# Patient Record
Sex: Female | Born: 1990 | Race: Black or African American | Hispanic: No | Marital: Single | Smoking: Never smoker
Health system: Southern US, Community
[De-identification: ages and names within clinical notes are randomized; demographics above are authoritative.]

## PROBLEM LIST (undated history)

## (undated) DIAGNOSIS — Z789 Other specified health status: Secondary | ICD-10-CM

## (undated) HISTORY — PX: NO PAST SURGERIES: SHX2092

---

## 2011-07-31 ENCOUNTER — Encounter: Payer: Self-pay | Admitting: Emergency Medicine

## 2011-07-31 ENCOUNTER — Emergency Department (INDEPENDENT_AMBULATORY_CARE_PROVIDER_SITE_OTHER)
Admission: EM | Admit: 2011-07-31 | Discharge: 2011-07-31 | Disposition: A | Discharge status: Other Acute Inpatient Hospital | Source: Home / Self Care | Attending: Emergency Medicine | Admitting: Emergency Medicine

## 2011-07-31 DIAGNOSIS — H02849 Edema of unspecified eye, unspecified eyelid: Secondary | ICD-10-CM

## 2011-07-31 MED ORDER — TOBRAMYCIN-DEXAMETHASONE 0.3-0.05 % OP SUSP: 2.0000 [drp] | Freq: Three times a day (TID) | OPHTHALMIC | Status: DC

## 2011-07-31 MED ORDER — TETRACAINE HCL 0.5 % OP SOLN
OPHTHALMIC | Status: AC
  Filled 2011-07-31: qty 2

## 2011-07-31 MED ORDER — TOBRAMYCIN-DEXAMETHASONE 0.3-0.1 % OP OINT: TOPICAL_OINTMENT | Freq: Three times a day (TID) | OPHTHALMIC | Status: AC

## 2011-07-31 NOTE — ED Notes (Signed)
Last night noticed redness, patient removed contacts last night and reports seeing a bump under left upper lid.  Eye red, sore.  Vision more blurry

## 2011-07-31 NOTE — ED Notes (Signed)
Attempted triage, Allison Nguyen emt at bedside obtaining vital signs

## 2011-07-31 NOTE — ED Notes (Signed)
Dr coll at bedside 

## 2011-07-31 NOTE — ED Notes (Signed)
Returned eye box, eye wash, returned tetracaine to pyxis

## 2011-07-31 NOTE — ED Provider Notes (Addendum)
History     CSN: [id number] Arrival date & time: 07/31/2011  2:35 PM   First MD Initiated Contact with Patient 07/31/11 1339      Chief Complaint  Patient presents with  . Eye Problem     HPI Comments: Last night, while I removed my contact lenses felt like my L upper eyelid was tender and itchy and looked like it was swollen..  Patient is a 20 y.o. female presenting with eye problem. The history is provided by the patient.  Eye Problem  This is a new problem. The current episode started yesterday. The problem occurs constantly. The problem has not changed since onset.There is pain in the left eye. The injury mechanism was contact lenses. The pain is at a severity of 2/10. The pain is mild. There is no history of trauma to the eye. There is no known exposure to pink eye. She wears contacts. Associated symptoms include blurred vision, foreign body sensation, eye redness and itching. Pertinent negatives include no decreased vision, no discharge, no nausea and no vomiting. She has tried nothing for the symptoms.    History reviewed. No pertinent past medical history.  History reviewed. No pertinent past surgical history.  Family History  Problem Relation Age of Onset  . Hypertension Other     History  Substance Use Topics  . Smoking status: Never Smoker   . Smokeless tobacco: Not on file  . Alcohol Use: No    OB History    Grav Para Term Preterm Abortions TAB SAB Ect Mult Living                  Review of Systems  Constitutional: Negative for fever.  Eyes: Positive for blurred vision, pain, redness and itching. Negative for discharge and visual disturbance.  Gastrointestinal: Negative for nausea and vomiting.  Skin: Positive for itching.    Allergies  Review of patient's allergies indicates no known allergies.  Home Medications   Current Outpatient Rx  Name Route Sig Dispense Refill  . TOBRAMYCIN-DEXAMETHASONE 0.3-0.1 % OP OINT Left Eye Place into the left eye 3  (three) times daily. Use for 5 days. 3.5 g 0    BP 125/60  Pulse 64  Temp(Src) 97.2 F (36.2 C) (Oral)  Resp 16  SpO2 100%  LMP 07/10/2011  Physical Exam  Nursing note and vitals reviewed. Constitutional: She appears well-nourished. No distress.  HENT:  Head: Normocephalic.  Eyes: Conjunctivae, EOM and lids are normal. Pupils are equal, round, and reactive to light. No foreign bodies found. Left eye exhibits no discharge. No foreign body present in the left eye. No scleral icterus.      ED Course  Procedures (including critical care time)  Labs Reviewed - No data to display No results found.   1. Eyelid edema       MDM  NO FOREIGN BODY AND NO HORDUOLUM        Jimmie Molly, MD 07/31/11 1736  Jimmie Molly, MD 07/31/11 [phone]

## 2013-11-22 ENCOUNTER — Encounter (HOSPITAL_COMMUNITY): Payer: Self-pay | Admitting: Emergency Medicine

## 2013-11-22 ENCOUNTER — Emergency Department (INDEPENDENT_AMBULATORY_CARE_PROVIDER_SITE_OTHER)
Admission: EM | Admit: 2013-11-22 | Discharge: 2013-11-22 | Disposition: A | Discharge status: Home / Self Care | Source: Home / Self Care | Attending: Family Medicine | Admitting: Family Medicine

## 2013-11-22 DIAGNOSIS — A084 Viral intestinal infection, unspecified: Secondary | ICD-10-CM

## 2013-11-22 DIAGNOSIS — A088 Other specified intestinal infections: Secondary | ICD-10-CM

## 2013-11-22 MED ORDER — ONDANSETRON 4 MG PO TBDP
ORAL_TABLET | ORAL | Status: AC
  Filled 2013-11-22: qty 2

## 2013-11-22 MED ORDER — ONDANSETRON HCL 8 MG PO TABS: 8.0000 mg | ORAL_TABLET | Freq: Three times a day (TID) | ORAL | Status: DC | PRN

## 2013-11-22 MED ORDER — ONDANSETRON 4 MG PO TBDP
8.0000 mg | ORAL_TABLET | Freq: Once | ORAL | Status: AC
  Administered 2013-11-22: 8 mg via ORAL

## 2013-11-22 NOTE — ED Provider Notes (Signed)
Allison EmeryKirsten Nguyen is a 23 y.o. female who presents to Urgent Care today for vomiting and diarrhea present for one day. Patient notes abdominal cramping. She denies any blood in her vomit or stool or black tarry stool or coffee-ground emesis. She notes subjective fever which is past. She's tried Tylenol which helped a bit. No chest pains palpitations or shortness of breath.   No past medical history on file. History  Substance Use Topics  . Smoking status: Never Smoker   . Smokeless tobacco: Not on file  . Alcohol Use: No   ROS as above Medications: Current Facility-Administered Medications  Medication Dose Route Frequency Provider Last Rate Last Dose  . ondansetron (ZOFRAN-ODT) disintegrating tablet 8 mg  8 mg Oral Once Rodolph BongEvan S Baldemar Dady, MD       Current Outpatient Prescriptions  Medication Sig Dispense Refill  . ondansetron (ZOFRAN) 8 MG tablet Take 1 tablet (8 mg total) by mouth every 8 (eight) hours as needed for nausea or vomiting.  20 tablet  0    Exam:  BP 116/78  Pulse 66  Temp(Src) 97.9 F (36.6 C) (Oral)  Resp 18  SpO2 100% Gen: Well NAD HEENT: EOMI,  MMM Lungs: Normal work of breathing. CTABL Heart: RRR no MRG Abd: NABS, Soft. NT, ND no rebound or guarding Exts: Brisk capillary refill, warm and well perfused.    Assessment and Plan: 23 y.o. female with viral gastroenteritis. Plan to treat symptomatically with Zofran and over-the-counter Imodium. Followup with primary care provider as needed.  Discussed warning signs or symptoms. Please see discharge instructions. Patient expresses understanding.    Rodolph BongEvan S Tristian Sickinger, MD 11/22/13 [phone]

## 2013-11-22 NOTE — Discharge Instructions (Signed)
Thank you for coming in today. If your belly pain worsens, or you have high fever, bad vomiting, blood in your stool or black tarry stool go to the Emergency Room.  Viral Gastroenteritis Viral gastroenteritis is also known as stomach flu. This condition affects the stomach and intestinal tract. It can cause sudden diarrhea and vomiting. The illness typically lasts 3 to 8 days. Most people develop an immune response that eventually gets rid of the virus. While this natural response develops, the virus can make you quite ill. CAUSES  Many different viruses can cause gastroenteritis, such as rotavirus or noroviruses. You can catch one of these viruses by consuming contaminated food or water. You may also catch a virus by sharing utensils or other personal items with an infected person or by touching a contaminated surface. SYMPTOMS  The most common symptoms are diarrhea and vomiting. These problems can cause a severe loss of body fluids (dehydration) and a body salt (electrolyte) imbalance. Other symptoms may include:  Fever.  Headache.  Fatigue.  Abdominal pain. DIAGNOSIS  Your caregiver can usually diagnose viral gastroenteritis based on your symptoms and a physical exam. A stool sample may also be taken to test for the presence of viruses or other infections. TREATMENT  This illness typically goes away on its own. Treatments are aimed at rehydration. The most serious cases of viral gastroenteritis involve vomiting so severely that you are not able to keep fluids down. In these cases, fluids must be given through an intravenous line (IV). HOME CARE INSTRUCTIONS   Drink enough fluids to keep your urine clear or pale yellow. Drink small amounts of fluids frequently and increase the amounts as tolerated.  Ask your caregiver for specific rehydration instructions.  Avoid:  Foods high in sugar.  Alcohol.  Carbonated drinks.  Tobacco.  Juice.  Caffeine drinks.  Extremely hot or cold  fluids.  Fatty, greasy foods.  Too much intake of anything at one time.  Dairy products until 24 to 48 hours after diarrhea stops.  You may consume probiotics. Probiotics are active cultures of beneficial bacteria. They may lessen the amount and number of diarrheal stools in adults. Probiotics can be found in yogurt with active cultures and in supplements.  Wash your hands well to avoid spreading the virus.  Only take over-the-counter or prescription medicines for pain, discomfort, or fever as directed by your caregiver. Do not give aspirin to children. Antidiarrheal medicines are not recommended.  Ask your caregiver if you should continue to take your regular prescribed and over-the-counter medicines.  Keep all follow-up appointments as directed by your caregiver. SEEK IMMEDIATE MEDICAL CARE IF:   You are unable to keep fluids down.  You do not urinate at least once every 6 to 8 hours.  You develop shortness of breath.  You notice blood in your stool or vomit. This may look like coffee grounds.  You have abdominal pain that increases or is concentrated in one small area (localized).  You have persistent vomiting or diarrhea.  You have a fever.  The patient is a child younger than 3 months, and he or she has a fever.  The patient is a child older than 3 months, and he or she has a fever and persistent symptoms.  The patient is a child older than 3 months, and he or she has a fever and symptoms suddenly get worse.  The patient is a baby, and he or she has no tears when crying. MAKE SURE YOU:  Understand these instructions.  Will watch your condition.  Will get help right away if you are not doing well or get worse. Document Released: 09/01/2005 Document Revised: 11/24/2011 Document Reviewed: 06/18/2011 [facility] Patient Information 2014 [place].

## 2013-11-22 NOTE — ED Notes (Signed)
C/o vomiting onset yesterday x 7 and 3 times today.  Diarrhea x1 yesterday and 2 today.  C/o pain all over her abdomen.  C/o sweating after vomiting, but did not check to see if she had a fever.

## 2014-03-07 ENCOUNTER — Encounter (HOSPITAL_COMMUNITY): Payer: Self-pay | Admitting: Emergency Medicine

## 2014-03-07 ENCOUNTER — Emergency Department (INDEPENDENT_AMBULATORY_CARE_PROVIDER_SITE_OTHER)
Admission: EM | Admit: 2014-03-07 | Discharge: 2014-03-07 | Disposition: A | Discharge status: Home / Self Care | Source: Home / Self Care | Attending: Family Medicine | Admitting: Family Medicine

## 2014-03-07 DIAGNOSIS — H109 Unspecified conjunctivitis: Secondary | ICD-10-CM

## 2014-03-07 MED ORDER — CIPROFLOXACIN HCL 0.3 % OP SOLN: OPHTHALMIC | Status: DC

## 2014-03-07 MED ORDER — CIPROFLOXACIN HCL 0.3 % OP OINT
TOPICAL_OINTMENT | OPHTHALMIC | Status: DC
Start: 2014-03-07 — End: 2014-11-06

## 2014-03-07 NOTE — Discharge Instructions (Signed)
Nieve,   It was estimated am concerned he had an infection caused by the contact lenses. EDD is the antibiotic for the next 7 days. He can use the drops or the ointment, whichever is cheaper for her whichever you prefer. If the problem gets worse or is not improving then please followup with the eye doctor.   Sincerely,   Dr. Clinton SawyerWilliamson

## 2014-03-07 NOTE — ED Provider Notes (Signed)
CSN: [id number]     Arrival date & time 03/07/14  1753 History   First MD Initiated Contact with Patient 03/07/14 1841     Chief Complaint  Patient presents with  . Eye Problem   (Consider location/radiation/quality/duration/timing/severity/associated sxs/prior Treatment) HPI  Eye irritation - bilateral, 3 days duration, started after the patient used contact lenses for the first time in several months. She will contact overnight and another next morning. Since that time she's had redness and right sensitivity. She has not been wearing her contacts but has been wearing her glasses. She denies any fever or chills.  History reviewed. No pertinent past medical history. History reviewed. No pertinent past surgical history. Family History  Problem Relation Age of Onset  . Hypertension Other    History  Substance Use Topics  . Smoking status: Never Smoker   . Smokeless tobacco: Not on file  . Alcohol Use: 1.8 oz/week    1 Cans of beer, 2 Shots of liquor per week     Comment: 3 times/week a mixture.   OB History   Grav Para Term Preterm Abortions TAB SAB Ect Mult Living                 Review of Systems Positive for eye pain irritation bilaterally Negative for vision changes Allergies  Review of patient's allergies indicates no known allergies.  Home Medications   Prior to Admission medications   Medication Sig Start Date End Date Taking? Authorizing Emanuel Dowson  ciprofloxacin (CILOXAN) 0.3 % ophthalmic ointment 1/2 inch ribbon into both eye 3 times per day for first 2 days, then 1/2 inch ribbon 2 times per day for next 5 days 03/07/14   Garnetta BuddyEdward Williamson V, MD  ciprofloxacin (CILOXAN) 0.3 % ophthalmic solution Administer 1 drop, every 2 hours, while awake, for 2 days. Then 1 drop, every 4 hours, while awake, for the next 5 days. 03/07/14   Garnetta BuddyEdward Williamson V, MD  ondansetron (ZOFRAN) 8 MG tablet Take 1 tablet (8 mg total) by mouth every 8 (eight) hours as needed for nausea or  vomiting. 11/22/13   Rodolph BongEvan S Corey, MD   BP 119/71  Pulse 76  Temp(Src) 98.6 F (37 C) (Oral)  Resp 16  SpO2 100%  LMP 01/25/2014 Physical Exam Gen: Young African American female, well-appearing, pleasant Eyes: injected conjunctiva bilaterally with mild chemosis, light sensitivity present, pupils equal round reactive to light, extraocular movements intact, fluorescein eye exam without evidence of abrasion  ED Course  Procedures (including critical care time) Labs Review Labs Reviewed - No data to display  Imaging Review No results found.   MDM   1. Bilateral conjunctivitis    No evidence of abrasions or lacerations on fluoroscein eye exam. I will treat as like a bacterial conjunctivitis with possibility of Pseudomonas given her use of contact lenses. She will use Cipro eyedrops for 7 days. Given precautions to follow up with ophthalmologist.    Garnetta BuddyEdward Williamson V, MD 03/07/14 [phone]

## 2014-03-07 NOTE — ED Notes (Signed)
See physician note

## 2014-03-08 NOTE — ED Provider Notes (Signed)
Medical screening examination/treatment/procedure(s) were performed by a resident physician and as supervising physician I was immediately available for consultation/collaboration.  Leslee Homeavid Keller, M.D.  Reuben Likesavid C Keller, MD 03/08/14 [phone]

## 2014-03-16 ENCOUNTER — Emergency Department (INDEPENDENT_AMBULATORY_CARE_PROVIDER_SITE_OTHER)
Admission: EM | Admit: 2014-03-16 | Discharge: 2014-03-16 | Disposition: A | Discharge status: Home / Self Care | Source: Home / Self Care | Attending: Family Medicine | Admitting: Family Medicine

## 2014-03-16 ENCOUNTER — Other Ambulatory Visit (HOSPITAL_COMMUNITY)
Admission: RE | Admit: 2014-03-16 | Discharge: 2014-03-16 | Disposition: A | Discharge status: Home / Self Care | Source: Ambulatory Visit | Attending: Family Medicine | Admitting: Family Medicine

## 2014-03-16 ENCOUNTER — Encounter (HOSPITAL_COMMUNITY): Payer: Self-pay | Admitting: Emergency Medicine

## 2014-03-16 DIAGNOSIS — N760 Acute vaginitis: Secondary | ICD-10-CM | POA: Insufficient documentation

## 2014-03-16 DIAGNOSIS — A499 Bacterial infection, unspecified: Secondary | ICD-10-CM

## 2014-03-16 DIAGNOSIS — B9689 Other specified bacterial agents as the cause of diseases classified elsewhere: Secondary | ICD-10-CM

## 2014-03-16 DIAGNOSIS — Z113 Encounter for screening for infections with a predominantly sexual mode of transmission: Secondary | ICD-10-CM | POA: Insufficient documentation

## 2014-03-16 LAB — POCT URINALYSIS DIP (DEVICE)
Bilirubin Urine: NEGATIVE
Glucose, UA: NEGATIVE mg/dL
Ketones, ur: NEGATIVE mg/dL
Leukocytes, UA: NEGATIVE
Nitrite: NEGATIVE
Protein, ur: NEGATIVE mg/dL
Specific Gravity, Urine: >=1.03 (ref 1.005–1.030)
Urobilinogen, UA: 0.2 mg/dL (ref 0.0–1.0)
pH: 6 (ref 5.0–8.0)

## 2014-03-16 LAB — POCT PREGNANCY, URINE: PREG TEST UR: NEGATIVE

## 2014-03-16 MED ORDER — METRONIDAZOLE 500 MG PO TABS: 500.0000 mg | ORAL_TABLET | Freq: Two times a day (BID) | ORAL | Status: DC

## 2014-03-16 NOTE — ED Notes (Signed)
Pt reports    Symptoms  Of         Vaginal     Itching /  Irritation          With a  White  Discharge            With  Symptoms  X  sev  Weeks         Pt  Reports  She  Has   Changed  Soaps         And  Has  Been  Using  Different tissue  As  Well

## 2014-03-16 NOTE — ED Provider Notes (Signed)
CSN: [id number]     Arrival date & time 03/16/14  1251 History   First MD Initiated Contact with Patient 03/16/14 1350     Chief Complaint  Patient presents with  . Vaginal Discharge   (Consider location/radiation/quality/duration/timing/severity/associated sxs/prior Treatment) Patient is a 23 y.o. female presenting with vaginal discharge. The history is provided by the patient. No language interpreter was used.  Vaginal Discharge Quality:  Thick and white Severity:  Moderate Onset quality:  Gradual Timing:  Constant Progression:  Worsening Chronicity:  New Relieved by:  Nothing Worsened by:  Nothing tried Ineffective treatments:  None tried Associated symptoms: no abdominal pain   Risk factors: no STI exposure     History reviewed. No pertinent past medical history. History reviewed. No pertinent past surgical history. Family History  Problem Relation Age of Onset  . Hypertension Other    History  Substance Use Topics  . Smoking status: Never Smoker   . Smokeless tobacco: Not on file  . Alcohol Use: 1.8 oz/week    1 Cans of beer, 2 Shots of liquor per week     Comment: 3 times/week a mixture.   OB History   Grav Para Term Preterm Abortions TAB SAB Ect Mult Living                 Review of Systems  Gastrointestinal: Negative for abdominal pain.  Genitourinary: Positive for vaginal discharge.  All other systems reviewed and are negative.   Allergies  Review of patient's allergies indicates no known allergies.  Home Medications   Prior to Admission medications   Medication Sig Start Date End Date Taking? Authorizing Provider  ciprofloxacin (CILOXAN) 0.3 % ophthalmic ointment 1/2 inch ribbon into both eye 3 times per day for first 2 days, then 1/2 inch ribbon 2 times per day for next 5 days 03/07/14   Garnetta BuddyEdward Williamson V, MD  ciprofloxacin (CILOXAN) 0.3 % ophthalmic solution Administer 1 drop, every 2 hours, while awake, for 2 days. Then 1 drop, every 4 hours, while  awake, for the next 5 days. 03/07/14   Garnetta BuddyEdward Williamson V, MD  ondansetron (ZOFRAN) 8 MG tablet Take 1 tablet (8 mg total) by mouth every 8 (eight) hours as needed for nausea or vomiting. 11/22/13   Rodolph BongEvan S Corey, MD   BP 121/79  Pulse 88  Temp(Src) 98.4 F (36.9 C) (Oral)  Resp 18  SpO2 100%  LMP 01/25/2014 Physical Exam  Constitutional: She appears well-developed and well-nourished.  HENT:  Head: Normocephalic and atraumatic.  Eyes: Conjunctivae are normal. Pupils are equal, round, and reactive to light.  Neck: Normal range of motion. Neck supple.  Cardiovascular: Normal rate.   Pulmonary/Chest: Effort normal.  Abdominal: Soft. There is no tenderness.  Genitourinary: Vaginal discharge found.  Vaginal discharge,  Thick white,  Adnexa no masses,  Cervix nontender  Musculoskeletal: Normal range of motion.  Neurological: She is alert.  Skin: Skin is warm.  Psychiatric: She has a normal mood and affect.    ED Course  Procedures (including critical care time) Labs Review Labs Reviewed  POCT URINALYSIS DIP (DEVICE) - Abnormal; Notable for the following:    Hgb urine dipstick TRACE (*)    All other components within normal limits  POCT PREGNANCY, URINE    Imaging Review No results found.   MDM   1. BV (bacterial vaginosis)    Flagyl rx Cultures pending    Elson AreasLeslie K Sofia, PA-C 03/16/14 1428

## 2014-03-16 NOTE — Discharge Instructions (Signed)
Bacterial Vaginosis Bacterial vaginosis is a vaginal infection that occurs when the normal balance of bacteria in the vagina is disrupted. It results from an overgrowth of certain bacteria. This is the most common vaginal infection in women of childbearing age. Treatment is important to prevent complications, especially in pregnant women, as it can cause a premature delivery. CAUSES  Bacterial vaginosis is caused by an increase in harmful bacteria that are normally present in smaller amounts in the vagina. Several different kinds of bacteria can cause bacterial vaginosis. However, the reason that the condition develops is not fully understood. RISK FACTORS Certain activities or behaviors can put you at an increased risk of developing bacterial vaginosis, including:  Having a new sex partner or multiple sex partners.  Douching.  Using an intrauterine device (IUD) for contraception. Women do not get bacterial vaginosis from toilet seats, bedding, swimming pools, or contact with objects around them. SIGNS AND SYMPTOMS  Some women with bacterial vaginosis have no signs or symptoms. Common symptoms include:  Grey vaginal discharge.  A fishlike odor with discharge, especially after sexual intercourse.  Itching or burning of the vagina and vulva.  Burning or pain with urination. DIAGNOSIS  Your health care provider will take a medical history and examine the vagina for signs of bacterial vaginosis. A sample of vaginal fluid may be taken. Your health care provider will look at this sample under a microscope to check for bacteria and abnormal cells. A vaginal pH test may also be done.  TREATMENT  Bacterial vaginosis may be treated with antibiotic medicines. These may be given in the form of a pill or a vaginal cream. A second round of antibiotics may be prescribed if the condition comes back after treatment.  HOME CARE INSTRUCTIONS   Only take over-the-counter or prescription medicines as  directed by your health care provider.  If antibiotic medicine was prescribed, take it as directed. Make sure you finish it even if you start to feel better.  Do not have sex until treatment is completed.  Tell all sexual partners that you have a vaginal infection. They should see their health care provider and be treated if they have problems, such as a mild rash or itching.  Practice safe sex by using condoms and only having one sex partner. SEEK MEDICAL CARE IF:   Your symptoms are not improving after 3 days of treatment.  You have increased discharge or pain.  You have a fever. MAKE SURE YOU:   Understand these instructions.  Will watch your condition.  Will get help right away if you are not doing well or get worse. FOR MORE INFORMATION  Centers for Disease Control and Prevention, Division of STD Prevention: www.cdc.gov/std American Sexual Health Association (ASHA): www.ashastd.org  Document Released: 09/01/2005 Document Revised: 06/22/2013 Document Reviewed: 04/13/2013 [employer] Patient Information 2015 [employer]. This information is not intended to replace advice given to you by your health care provider. Make sure you discuss any questions you have with your health care provider.  

## 2014-03-17 NOTE — ED Provider Notes (Signed)
Medical screening examination/treatment/procedure(s) were performed by a resident physician or non-physician practitioner and as the supervising physician I was immediately available for consultation/collaboration.  Mylo Choi, MD    Velicia Dejager S Meloney Feld, MD 03/17/14 1716 

## 2014-09-11 ENCOUNTER — Other Ambulatory Visit (HOSPITAL_COMMUNITY)
Admission: RE | Admit: 2014-09-11 | Discharge: 2014-09-11 | Disposition: A | Discharge status: Home / Self Care | Source: Ambulatory Visit | Attending: Emergency Medicine | Admitting: Emergency Medicine

## 2014-09-11 ENCOUNTER — Encounter (HOSPITAL_COMMUNITY): Payer: Self-pay | Admitting: Emergency Medicine

## 2014-09-11 ENCOUNTER — Emergency Department (INDEPENDENT_AMBULATORY_CARE_PROVIDER_SITE_OTHER)
Admission: EM | Admit: 2014-09-11 | Discharge: 2014-09-11 | Disposition: A | Discharge status: Home / Self Care | Source: Home / Self Care | Attending: Emergency Medicine | Admitting: Emergency Medicine

## 2014-09-11 DIAGNOSIS — N760 Acute vaginitis: Secondary | ICD-10-CM | POA: Diagnosis present

## 2014-09-11 DIAGNOSIS — Z113 Encounter for screening for infections with a predominantly sexual mode of transmission: Secondary | ICD-10-CM | POA: Insufficient documentation

## 2014-09-11 LAB — POCT URINALYSIS DIP (DEVICE)
BILIRUBIN URINE: NEGATIVE
Glucose, UA: NEGATIVE mg/dL
Ketones, ur: NEGATIVE mg/dL
NITRITE: NEGATIVE
PH: 7.5 (ref 5.0–8.0)
PROTEIN: NEGATIVE mg/dL
Specific Gravity, Urine: 1.015 (ref 1.005–1.030)
UROBILINOGEN UA: 0.2 mg/dL (ref 0.0–1.0)

## 2014-09-11 LAB — HIV ANTIBODY (ROUTINE TESTING W REFLEX): HIV: NONREACTIVE

## 2014-09-11 LAB — POCT PREGNANCY, URINE: Preg Test, Ur: NEGATIVE

## 2014-09-11 MED ORDER — FLUCONAZOLE 150 MG PO TABS: 150.0000 mg | ORAL_TABLET | Freq: Once | ORAL | Status: DC

## 2014-09-11 MED ORDER — METRONIDAZOLE 500 MG PO TABS
500.0000 mg | ORAL_TABLET | Freq: Two times a day (BID) | ORAL | Status: DC
Start: 2014-09-11 — End: 2014-11-06

## 2014-09-11 NOTE — ED Notes (Signed)
Pt states that she has had vaginal itching for 2 weeks.

## 2014-09-11 NOTE — Discharge Instructions (Signed)
Bacterial Vaginosis Bacterial vaginosis is an infection of the vagina. It happens when too many of certain germs (bacteria) grow in the vagina. HOME CARE  Take your medicine as told by your doctor.  Finish your medicine even if you start to feel better.  Do not have sex until you finish your medicine and are better.  Tell your sex partner that you have an infection. They should see their doctor for treatment.  Practice safe sex. Use condoms. Have only one sex partner. GET HELP IF:  You are not getting better after 3 days of treatment.  You have more grey fluid (discharge) coming from your vagina than before.  You have more pain than before.  You have a fever. MAKE SURE YOU:   Understand these instructions.  Will watch your condition.  Will get help right away if you are not doing well or get worse. Document Released: 06/10/2008 Document Revised: 06/22/2013 Document Reviewed: 04/13/2013 [facility] Patient Information 2015 [place]. This information is not intended to replace advice given to you by your health care provider. Make sure you discuss any questions you have with your health care provider.  To restore the normal balance of "good bacteria" in your system.  Take a probiotic once daily.  These can be gotten over the counter at the drug store without a prescription and come under various brand names such as Culturelle, Align, Florastore, and [employer].  The best thing to do is to ask your pharmacist to recommend a good probiotic that is not too expensive.

## 2014-09-11 NOTE — ED Provider Notes (Signed)
   Chief Complaint   Vaginal Itching   History of Present Illness   Allison Nguyen is a 23 year old female who has had a two-week history of vulvar and vaginal itching and irritation, discharge, and older. She denies any rash, ulcerations, blisters, or external lesions. She has occasional pelvic pain. She notes some urinary urgency but no frequency or dysuria. No blood in the urine. She denies any lower back pain. She's had no fever, chills, nausea, or vomiting. She has a history of bacterial vaginosis in the past. Her last menstrual period is been within the past month. She is sexually active but only with a single female partner.  Review of Systems   Other than as noted above, the patient denies any of the following symptoms: Systemic:  No fever or chills GI:  No abdominal pain, nausea, vomiting, diarrhea, constipation, melena or hematochezia. GU:  No dysuria, frequency, urgency, hematuria, vaginal discharge, itching, or abnormal vaginal bleeding.  PMFSH   Past medical history, family history, social history, meds, and allergies were reviewed.    Physical Examination    Vital signs:  BP 115/72 mmHg  Pulse 68  Temp(Src) 98.3 F (36.8 C) (Oral)  Resp 14  SpO2 100% General:  Alert, oriented and in no distress. Lungs:  Breath sounds clear and equal bilaterally.  No wheezes, rales or rhonchi. Heart:  Regular rhythm.  No gallops or murmers. Abdomen:  Soft, flat and non-distended.  No organomegaly or mass.  No tenderness, guarding or rebound.  Bowel sounds normally active. Pelvic exam:  Normal external genitalia, no ulcerations or blisters. Vaginal and cervical mucosa were normal. There was a scant amount of mucoid discharge coming from the cervical os. No pain on cervical motion. Uterus was normal in size and shape and nontender. No adnexal tenderness or mass..  DNA probes for gonorrhea, Chlamydia, Trichomonas, Gardnerella, Candida were obtained. Skin:  Clear, warm and  dry.  Chaperoned by Blima Ledgeriffany Lytle, RN who was present throughout the pelvic exam.   Labs   Results for orders placed or performed during the hospital encounter of 09/11/14  Pregnancy, urine POC  Result Value Ref Range   Preg Test, Ur NEGATIVE NEGATIVE    Assessment   The encounter diagnosis was Vaginitis.  Differential diagnosis is Candida, Gardnerella, or trichomonas. Will treat with Diflucan and metronidazole. Results of DNA probes pending, and we'll call patient back with any positive results.       Plan    1.  Meds:  The following meds were prescribed:   Discharge Medication List as of 09/11/2014 12:10 PM    START taking these medications   Details  fluconazole (DIFLUCAN) 150 MG tablet Take 1 tablet (150 mg total) by mouth once., Starting 09/11/2014, Normal       Also given a prescription for metronidazole 500 mg, #14, 1 twice a day.  2.  Patient Education/Counseling:  The patient was given appropriate handouts, self care instructions, and instructed in symptomatic relief.    3.  Follow up:  The patient was told to follow up here if no better in 3 to 4 days, or sooner if becoming worse in any way, and given some red flag symptoms such as worsening pain, fever, persistent vomiting, or heavy vaginal bleeding which would prompt immediate return.       Reuben Likesavid C Owen Pagnotta, MD 09/11/14 [phone]

## 2014-09-12 LAB — CERVICOVAGINAL ANCILLARY ONLY
CHLAMYDIA, DNA PROBE: NEGATIVE
NEISSERIA GONORRHEA: NEGATIVE
Wet Prep (BD Affirm): NEGATIVE
Wet Prep (BD Affirm): NEGATIVE
Wet Prep (BD Affirm): NEGATIVE

## 2014-11-05 ENCOUNTER — Emergency Department (HOSPITAL_COMMUNITY)
Admission: EM | Admit: 2014-11-05 | Discharge: 2014-11-06 | Disposition: A | Discharge status: Home / Self Care | Attending: Emergency Medicine | Admitting: Emergency Medicine

## 2014-11-05 ENCOUNTER — Encounter (HOSPITAL_COMMUNITY): Payer: Self-pay | Admitting: *Deleted

## 2014-11-05 DIAGNOSIS — M549 Dorsalgia, unspecified: Secondary | ICD-10-CM | POA: Diagnosis not present

## 2014-11-05 DIAGNOSIS — R112 Nausea with vomiting, unspecified: Secondary | ICD-10-CM | POA: Insufficient documentation

## 2014-11-05 DIAGNOSIS — R1013 Epigastric pain: Secondary | ICD-10-CM | POA: Diagnosis present

## 2014-11-05 DIAGNOSIS — R197 Diarrhea, unspecified: Secondary | ICD-10-CM | POA: Diagnosis not present

## 2014-11-05 DIAGNOSIS — K2970 Gastritis, unspecified, without bleeding: Secondary | ICD-10-CM | POA: Insufficient documentation

## 2014-11-05 DIAGNOSIS — E860 Dehydration: Secondary | ICD-10-CM | POA: Insufficient documentation

## 2014-11-05 NOTE — ED Notes (Signed)
The pt drank too much alcohol last pm n vomiting and abd pain tioday.,  lmop one week

## 2014-11-05 NOTE — ED Provider Notes (Signed)
CSN: [id number]     Arrival date & time 11/05/14  2348 History  This chart was scribed for Joya Gaskins, MD by Abel Presto, ED Scribe. This patient was seen in room A13C/A13C and the patient's care was started at 12:31 AM.    Chief Complaint  Patient presents with  . Abdominal Pain    Patient is a 24 y.o. female presenting with abdominal pain. The history is provided by the patient. No language interpreter was used.  Abdominal Pain Pain location:  Epigastric Pain radiates to:  Does not radiate Pain severity:  Moderate Onset quality:  Sudden Duration:  1 day Timing:  Constant Progression:  Unchanged Chronicity:  New Context: alcohol use   Relieved by:  Nothing Worsened by:  Eating Associated symptoms: diarrhea, nausea and vomiting   Associated symptoms: no chest pain, no dysuria, no fever, no hematemesis, no hematochezia, no melena, no sore throat, no vaginal bleeding and no vaginal discharge   Diarrhea:    Number of occurrences:  1   Severity:  Mild   Duration:  1 day Nausea:    Severity:  Moderate   Onset quality:  Sudden   Timing:  Constant   Progression:  Unchanged Vomiting:    Number of occurrences:  10   Severity:  Moderate   Duration:  1 day   Timing:  Intermittent   Progression:  Unchanged  HPI Comments: Allison Nguyen is a 24 y.o. female who presents to the Emergency Department complaining of constant epigastric abdominal pain with onset last night. Pt notes EtOH use on an empty stomach. Pt unsure of what kind of EtOH involved. Pt able to tolerate fluids but no food. Pt notes associated mild back pain, vomiting approx x10 and mild diarrhea once . Pt denies h/o of abdominal surgeries and significant PMHx. Pt has NKDA. Pt denies fever, hematemesis, sore throat, chest pain, dysuria, frequency, melena, hematochezia, vaginal bleeding and vaginal discharge.    PMH -none Family History  Problem Relation Age of Onset  . Hypertension Other    History  Substance  Use Topics  . Smoking status: Never Smoker   . Smokeless tobacco: Not on file  . Alcohol Use: 1.8 oz/week    1 Cans of beer, 2 Shots of liquor per week     Comment: 3 times/week a mixture.   OB History    No data available     Review of Systems  Constitutional: Negative for fever.  HENT: Negative for sore throat.   Cardiovascular: Negative for chest pain.  Gastrointestinal: Positive for nausea, vomiting, abdominal pain and diarrhea. Negative for blood in stool, melena, hematochezia and hematemesis.  Genitourinary: Negative for dysuria, vaginal bleeding and vaginal discharge.  Musculoskeletal: Positive for back pain (mild).  All other systems reviewed and are negative.     Allergies  Review of patient's allergies indicates no known allergies.  Home Medications   Prior to Admission medications   Medication Sig Start Date End Date Taking? Authorizing Provider  ondansetron (ZOFRAN ODT) 8 MG disintegrating tablet Take 1 tablet (8 mg total) by mouth every 8 (eight) hours as needed for refractory nausea / vomiting. 11/06/14   Joya Gaskins, MD   BP 125/83 mmHg  Pulse 68  Temp(Src) 98.6 F (37 C)  Resp 18  Ht  (1.651 m)  Wt 130 lb (58.968 kg)  BMI 21.63 kg/m2  SpO2 100%  LMP 10/29/2014 Physical Exam  Nursing note and vitals reviewed.   CONSTITUTIONAL: Well developed/well nourished  HEAD: Normocephalic/atraumatic EYES: EOMI/PERRL, no icterus ENMT: Mucous membranes moist NECK: supple no meningeal signs SPINE/BACK:entire spine nontender CV: S1/S2 noted, no murmurs/rubs/gallops noted LUNGS: Lungs are clear to auscultation bilaterally, no apparent distress ABDOMEN: soft, nontender, no rebound or guarding, bowel sounds noted throughout abdomen GU:no cva tenderness NEURO: Pt is awake/alert/appropriate, moves all extremitiesx4.  No facial droop.   EXTREMITIES: pulses normal/equal, full ROM SKIN: warm, color normal PSYCH: no abnormalities of mood noted, alert and  oriented to situation   ED Course  Procedures  DIAGNOSTIC STUDIES: Oxygen Saturation is 100% on room air, normal by my interpretation.    COORDINATION OF CARE: 12:38 AM Discussed treatment plan with patient at beside, the patient agrees with the plan and has no further questions at this time.    Pt well appearing She is using phone and in no distress She is tolerating PO Discussed strict return precautions, but likely related to her ETOH use BP 109/62 mmHg  Pulse 81  Temp(Src) 98.4 F (36.9 C) (Oral)  Resp 16  Ht 5\' 5"  (1.651 m)  Wt 130 lb (58.968 kg)  BMI 21.63 kg/m2  SpO2 100%  LMP 10/29/2014  Labs Review Labs Reviewed  URINALYSIS, ROUTINE W REFLEX MICROSCOPIC - Abnormal; Notable for the following:    APPearance CLOUDY (*)    Specific Gravity, Urine 1.035 (*)    Hgb urine dipstick MODERATE (*)    Ketones, ur >80 (*)    Protein, ur 30 (*)    All other components within normal limits  URINE MICROSCOPIC-ADD ON - Abnormal; Notable for the following:    Squamous Epithelial / LPF FEW (*)    Bacteria, UA FEW (*)    All other components within normal limits  POC URINE PREG, ED   Medications  ondansetron (ZOFRAN-ODT) disintegrating tablet 8 mg (8 mg Oral Given 11/06/14 0054)     MDM   Final diagnoses:  Gastritis  Epigastric pain  Dehydration    Nursing notes including past medical history and social history reviewed and considered in documentation Labs/vital reviewed myself and considered during evaluation   I personally performed the services described in this documentation, which was scribed in my presence. The recorded information has been reviewed and is accurate.       Joya Gaskinsonald W Teanna Elem, MD 11/06/14 [phone]

## 2014-11-06 LAB — URINE MICROSCOPIC-ADD ON

## 2014-11-06 LAB — URINALYSIS, ROUTINE W REFLEX MICROSCOPIC
BILIRUBIN URINE: NEGATIVE
GLUCOSE, UA: NEGATIVE mg/dL
Ketones, ur: >80 mg/dL — AB
Leukocytes, UA: NEGATIVE
Nitrite: NEGATIVE
PROTEIN: 30 mg/dL — AB
SPECIFIC GRAVITY, URINE: 1.035 — AB (ref 1.005–1.030)
Urobilinogen, UA: 1 mg/dL (ref 0.0–1.0)
pH: 6 (ref 5.0–8.0)

## 2014-11-06 LAB — CBG MONITORING, ED: GLUCOSE-CAPILLARY: 410 mg/dL — AB (ref 70–99)

## 2014-11-06 MED ORDER — ONDANSETRON 8 MG PO TBDP: 8.0000 mg | ORAL_TABLET | Freq: Three times a day (TID) | ORAL | Status: DC | PRN

## 2014-11-06 MED ORDER — ONDANSETRON 4 MG PO TBDP
8.0000 mg | ORAL_TABLET | Freq: Once | ORAL | Status: AC
  Administered 2014-11-06: 8 mg via ORAL
  Filled 2014-11-06: qty 2

## 2014-11-06 NOTE — ED Notes (Signed)
Spoke to [facility] about urine pregnancy.  Confirmed result was negative.

## 2014-11-06 NOTE — Discharge Instructions (Signed)

## 2016-02-03 ENCOUNTER — Encounter (HOSPITAL_COMMUNITY): Payer: Self-pay

## 2016-02-03 DIAGNOSIS — Z3202 Encounter for pregnancy test, result negative: Secondary | ICD-10-CM | POA: Diagnosis present

## 2016-02-03 DIAGNOSIS — D72829 Elevated white blood cell count, unspecified: Secondary | ICD-10-CM | POA: Diagnosis present

## 2016-02-03 DIAGNOSIS — F1210 Cannabis abuse, uncomplicated: Secondary | ICD-10-CM | POA: Diagnosis present

## 2016-02-03 DIAGNOSIS — M6282 Rhabdomyolysis: Principal | ICD-10-CM | POA: Diagnosis present

## 2016-02-03 LAB — URINALYSIS, ROUTINE W REFLEX MICROSCOPIC
Bilirubin Urine: NEGATIVE
GLUCOSE, UA: NEGATIVE mg/dL
Nitrite: NEGATIVE
PROTEIN: 30 mg/dL — AB
Specific Gravity, Urine: 1.023 (ref 1.005–1.030)
pH: 5.5 (ref 5.0–8.0)

## 2016-02-03 LAB — CBC
HCT: 38.8 % (ref 36.0–46.0)
Hemoglobin: 12.9 g/dL (ref 12.0–15.0)
MCH: 29.6 pg (ref 26.0–34.0)
MCHC: 33.2 g/dL (ref 30.0–36.0)
MCV: 89 fL (ref 78.0–100.0)
PLATELETS: 255 10*3/uL (ref 150–400)
RBC: 4.36 MIL/uL (ref 3.87–5.11)
RDW: 12.4 % (ref 11.5–15.5)
WBC: 12.4 10*3/uL — ABNORMAL HIGH (ref 4.0–10.5)

## 2016-02-03 LAB — COMPREHENSIVE METABOLIC PANEL
ALT: 27 U/L (ref 14–54)
AST: 42 U/L — AB (ref 15–41)
Albumin: 4.4 g/dL (ref 3.5–5.0)
Alkaline Phosphatase: 54 U/L (ref 38–126)
Anion gap: 12 (ref 5–15)
BUN: 10 mg/dL (ref 6–20)
CHLORIDE: 103 mmol/L (ref 101–111)
CO2: 22 mmol/L (ref 22–32)
CREATININE: 0.99 mg/dL (ref 0.44–1.00)
Calcium: 9.4 mg/dL (ref 8.9–10.3)
GFR calc Af Amer: >60 mL/min (ref >60)
Glucose, Bld: 125 mg/dL — ABNORMAL HIGH (ref 65–99)
Potassium: 3.6 mmol/L (ref 3.5–5.1)
Sodium: 137 mmol/L (ref 135–145)
Total Bilirubin: 0.6 mg/dL (ref 0.3–1.2)
Total Protein: 7.4 g/dL (ref 6.5–8.1)

## 2016-02-03 LAB — LIPASE, BLOOD: LIPASE: 23 U/L (ref 11–51)

## 2016-02-03 LAB — I-STAT BETA HCG BLOOD, ED (MC, WL, AP ONLY): I-stat hCG, quantitative: <5 m[IU]/mL (ref <5)

## 2016-02-03 LAB — URINE MICROSCOPIC-ADD ON

## 2016-02-03 NOTE — ED Notes (Signed)
Pt here with c/o generalized abdominal pain, nausea, vomiting, diarrhea, onset tonight. She also reports having vaginal burning and wants to be tested for STDs.

## 2016-02-04 ENCOUNTER — Inpatient Hospital Stay (HOSPITAL_COMMUNITY)
Admission: EM | Admit: 2016-02-04 | Discharge: 2016-02-06 | DRG: 558 | Disposition: A | Discharge status: Home / Self Care | Attending: Internal Medicine | Admitting: Internal Medicine

## 2016-02-04 ENCOUNTER — Emergency Department (HOSPITAL_COMMUNITY): Payer: Self-pay

## 2016-02-04 ENCOUNTER — Encounter (HOSPITAL_COMMUNITY): Payer: Self-pay | Admitting: Family Medicine

## 2016-02-04 DIAGNOSIS — R1013 Epigastric pain: Secondary | ICD-10-CM

## 2016-02-04 DIAGNOSIS — R112 Nausea with vomiting, unspecified: Secondary | ICD-10-CM | POA: Diagnosis not present

## 2016-02-04 DIAGNOSIS — D72829 Elevated white blood cell count, unspecified: Secondary | ICD-10-CM | POA: Diagnosis not present

## 2016-02-04 DIAGNOSIS — M6282 Rhabdomyolysis: Secondary | ICD-10-CM

## 2016-02-04 DIAGNOSIS — R109 Unspecified abdominal pain: Secondary | ICD-10-CM | POA: Diagnosis not present

## 2016-02-04 DIAGNOSIS — T796XXA Traumatic ischemia of muscle, initial encounter: Secondary | ICD-10-CM | POA: Diagnosis not present

## 2016-02-04 DIAGNOSIS — F1210 Cannabis abuse, uncomplicated: Secondary | ICD-10-CM

## 2016-02-04 HISTORY — DX: Other specified health status: Z78.9

## 2016-02-04 LAB — CK
CK TOTAL: 1206 U/L — AB (ref 38–234)
CK TOTAL: 1802 U/L — AB (ref 38–234)
CK TOTAL: 3069 U/L — AB (ref 38–234)
CK TOTAL: 2888 U/L — AB (ref 38–234)
CK TOTAL: 2978 U/L — AB (ref 38–234)

## 2016-02-04 MED ORDER — ADULT MULTIVITAMIN W/MINERALS CH
1.0000 | ORAL_TABLET | Freq: Every day | ORAL | Status: DC
  Administered 2016-02-04 – 2016-02-06 (×3): 1 via ORAL
  Filled 2016-02-04 (×3): qty 1

## 2016-02-04 MED ORDER — FOLIC ACID 1 MG PO TABS
1.0000 mg | ORAL_TABLET | Freq: Every day | ORAL | Status: DC
  Administered 2016-02-04 – 2016-02-06 (×3): 1 mg via ORAL
  Filled 2016-02-04 (×3): qty 1

## 2016-02-04 MED ORDER — THIAMINE HCL 100 MG/ML IJ SOLN: 100.0000 mg | Freq: Every day | INTRAMUSCULAR | Status: DC

## 2016-02-04 MED ORDER — SODIUM CHLORIDE 0.9 % IV SOLN
INTRAVENOUS | Status: DC
  Administered 2016-02-04 – 2016-02-06 (×6): via INTRAVENOUS

## 2016-02-04 MED ORDER — ENOXAPARIN SODIUM 40 MG/0.4ML ~~LOC~~ SOLN
40.0000 mg | SUBCUTANEOUS | Status: DC
  Administered 2016-02-04 – 2016-02-06 (×3): 40 mg via SUBCUTANEOUS
  Filled 2016-02-04 (×3): qty 0.4

## 2016-02-04 MED ORDER — ONDANSETRON HCL 4 MG/2ML IJ SOLN: 4.0000 mg | Freq: Four times a day (QID) | INTRAMUSCULAR | Status: DC | PRN

## 2016-02-04 MED ORDER — SODIUM CHLORIDE 0.9 % IV BOLUS (SEPSIS)
1000.0000 mL | Freq: Once | INTRAVENOUS | Status: AC
  Administered 2016-02-04: 1000 mL via INTRAVENOUS

## 2016-02-04 MED ORDER — LORAZEPAM 2 MG/ML IJ SOLN: 1.0000 mg | Freq: Four times a day (QID) | INTRAMUSCULAR | Status: DC | PRN

## 2016-02-04 MED ORDER — ACETAMINOPHEN 650 MG RE SUPP: 650.0000 mg | Freq: Four times a day (QID) | RECTAL | Status: DC | PRN

## 2016-02-04 MED ORDER — OXYCODONE HCL 5 MG PO TABS: 5.0000 mg | ORAL_TABLET | ORAL | Status: DC | PRN

## 2016-02-04 MED ORDER — LORAZEPAM 1 MG PO TABS: 1.0000 mg | ORAL_TABLET | Freq: Four times a day (QID) | ORAL | Status: DC | PRN

## 2016-02-04 MED ORDER — PANTOPRAZOLE SODIUM 40 MG IV SOLR
40.0000 mg | Freq: Once | INTRAVENOUS | Status: AC
  Administered 2016-02-04: 40 mg via INTRAVENOUS
  Filled 2016-02-04: qty 40

## 2016-02-04 MED ORDER — SODIUM CHLORIDE 0.9 % IV BOLUS (SEPSIS)
1000.0000 mL | Freq: Once | INTRAVENOUS | Status: AC
Start: 2016-02-04 — End: 2016-02-04
  Administered 2016-02-04: 1000 mL via INTRAVENOUS

## 2016-02-04 MED ORDER — VITAMIN B-1 100 MG PO TABS
100.0000 mg | ORAL_TABLET | Freq: Every day | ORAL | Status: DC
  Administered 2016-02-04 – 2016-02-06 (×3): 100 mg via ORAL
  Filled 2016-02-04 (×3): qty 1

## 2016-02-04 MED ORDER — ONDANSETRON HCL 4 MG PO TABS: 4.0000 mg | ORAL_TABLET | Freq: Four times a day (QID) | ORAL | Status: DC | PRN

## 2016-02-04 MED ORDER — ACETAMINOPHEN 325 MG PO TABS
650.0000 mg | ORAL_TABLET | Freq: Four times a day (QID) | ORAL | Status: DC | PRN
  Administered 2016-02-05: 650 mg via ORAL
  Filled 2016-02-04: qty 2

## 2016-02-04 NOTE — Care Management Note (Addendum)
Case Management Note  Patient Details  Name: Allison Nguyen MRN: [id number] Date of Birth: 03/07/1991  Subjective/Objective:                Presents with Rhabdomyolysis. Independent with ADL's. No PCP. No insurance.   Action/Plan:  [facility] with PCP and medication needs. Plan to home when medically stable. CM to f/u with disposition needs.    02/06/16 0915 CM cancelled appointment with [facility] 2/2  pt remains in hospital. [facility] and arranged 1 week f/u appointment. CM made pt aware.  Expected Discharge Date:                  Expected Discharge Plan:  Home/Self Care  In-House Referral:     Discharge planning Services  CM Consult  Post Acute Care Choice:    Choice offered to:     DME Arranged:    DME Agency:     HH Arranged:    HH Agency:     Status of Service:  In process, will continue to follow  Medicare Important Message Given:    Date Medicare IM Given:    Medicare IM give by:    Date Additional Medicare IM Given:    Additional Medicare Important Message give by:     If discussed at Long Length of Stay Meetings, dates discussed:    Additional Comments:  Epifanio LeschesCole, Dereon Corkery Hudson, [place],BSN,CM [phone] 02/04/2016, 2:02 PM

## 2016-02-04 NOTE — H&P (Addendum)
History and Physical    Allison Nguyen ZOX:[id number] DOB: 1991-04-15 DOA: 02/04/2016  PCP: No PCP Per Patient Patient coming from: Home   Chief Complaint: n/v abd pain, body aches.   HPI: Allison Nguyen is a 25 y.o. female with non-contributory medical history presenting w/ n/v/ abd pain and body aches. Gradual onset. Started in the afternoon on 02/03/16. Abdominal pain is throughout her entire abdomen with no focal tenderness. Symptoms would come and go in waves. Sometimes associated with food. Emesis 1 that was nonbloody but described as bile. Patient states that earlier in the morning she played 25 football games. Patient states that she is very competitive/athletic. After losing the first game she said that she tried particularly hard when the second game. Is not unusual for her to play to football games on the same day. Patient states that her oral intake have been decreased for the last day or 2 but she had tried to stay fairly well hydrated with water and Gatorade. Patient also began her menstrual cycle at time of onset of symptoms. Patient does endorse marijuana use but no other drug use. No reported history of vaginal discharge or irritation per my history. Patient denies fevers, diarrhea, dysuria, back pain, rash, chest pain, shortness of breath, palpitations. Patient states that her generalized body aches and muscle cramps and upper lower extremity started at the same time as her abdominal pain and nausea and this gradually worsened. After receiving multiple rounds of IV fluids in the ED her symptoms have improved.    ED Course: Objective findings outlined below. Patient received multiple liters of normal saline with improvement in overall symptomatology. He should be admitted for rhabdomyolysis  Review of Systems: As per HPI otherwise 10 point review of systems negative.   Ambulatory Status: no restrictions  Past Medical History  Diagnosis Date  . Medical history non-contributory      Past Surgical History  Procedure Laterality Date  . No past surgeries       reports that she has never smoked. She does not have any smokeless tobacco history on file. She reports that she drinks about 1.8 oz of alcohol per week. She reports that she uses illicit drugs (Marijuana) about 4 times per week.  No Known Allergies  Family History  Problem Relation Age of Onset  . Hypertension Other   . Kidney disease    . Heart disease      Prior to Admission medications   Medication Sig Start Date End Date Taking? Authorizing Provider  ondansetron (ZOFRAN ODT) 8 MG disintegrating tablet Take 1 tablet (8 mg total) by mouth every 8 (eight) hours as needed for refractory nausea / vomiting. Patient not taking: Reported on 02/04/2016 11/06/14   Zadie Rhine, MD    Physical Exam: Filed Vitals:   02/04/16 0830 02/04/16 0900 02/04/16 0930 02/04/16 1000  BP: 122/81 110/89 110/76 116/76  Pulse: 83 70 86 74  Temp:      TempSrc:      Resp:    16  SpO2: 100% 100% 100% 100%      Constitutional: NAD, calm, comfortable Eyes:  PERRL, lids and conjunctivae normal ENMT:  Mucous membranes are moist. Posterior pharynx clear of any exudate or lesions.  Neck:  normal, supple, no masses, no thyromegaly Respiratory:  clear to auscultation bilaterally, no wheezing, no crackles. Normal respiratory effort. No accessory muscle use.  Cardiovascular:  Regular rate and rhythm, no murmurs / rubs / gallops. No extremity edema. 2+ pedal pulses. No carotid  bruits.  Abdomen:  no tenderness, no masses palpated. No hepatosplenomegaly. Bowel sounds positive.  Musculoskeletal:  no clubbing / cyanosis. No joint deformity upper and lower extremities. Good ROM, no contractures. Normal muscle tone.  Skin:  no rashes, lesions, ulcers. No induration Neurologic:  CN 2-12 grossly intact. Sensation intact, Strength 5/5 in all 4.  Psychiatric:  Normal judgment and insight. Alert and oriented x 3. Normal mood.    Labs  on Admission: I have personally reviewed following labs and imaging studies  CBC:  Recent Labs Lab 02/03/16 2130  WBC 12.4*  HGB 12.9  HCT 38.8  MCV 89.0  PLT 255   Basic Metabolic Panel:  Recent Labs Lab 02/03/16 2130  NA 137  K 3.6  CL 103  CO2 22  GLUCOSE 125*  BUN 10  CREATININE 0.99  CALCIUM 9.4   GFR: CrCl cannot be calculated (Unknown ideal weight.). Liver Function Tests:  Recent Labs Lab 02/03/16 2130  AST 42*  ALT 27  ALKPHOS 54  BILITOT 0.6  PROT 7.4  ALBUMIN 4.4    Recent Labs Lab 02/03/16 2130  LIPASE 23   No results for input(s): AMMONIA in the last 168 hours. Coagulation Profile: No results for input(s): INR, PROTIME in the last 168 hours. Cardiac Enzymes:  Recent Labs Lab 02/04/16 0110 02/04/16 0417 02/04/16 0822  CKTOTAL 1206* 1802* 3069*   BNP (last 3 results) No results for input(s): PROBNP in the last 8760 hours. HbA1C: No results for input(s): HGBA1C in the last 72 hours. CBG: No results for input(s): GLUCAP in the last 168 hours. Lipid Profile: No results for input(s): CHOL, HDL, LDLCALC, TRIG, CHOLHDL, LDLDIRECT in the last 72 hours. Thyroid Function Tests: No results for input(s): TSH, T4TOTAL, FREET4, T3FREE, THYROIDAB in the last 72 hours. Anemia Panel: No results for input(s): VITAMINB12, FOLATE, FERRITIN, TIBC, IRON, RETICCTPCT in the last 72 hours. Urine analysis:    Component Value Date/Time   COLORURINE YELLOW 02/03/2016 2129   APPEARANCEUR CLOUDY* 02/03/2016 2129   LABSPEC 1.023 02/03/2016 2129   PHURINE 5.5 02/03/2016 2129   GLUCOSEU NEGATIVE 02/03/2016 2129   HGBUR LARGE* 02/03/2016 2129   BILIRUBINUR NEGATIVE 02/03/2016 2129   KETONESUR >80* 02/03/2016 2129   PROTEINUR 30* 02/03/2016 2129   UROBILINOGEN 1.0 11/06/2014 0058   NITRITE NEGATIVE 02/03/2016 2129   LEUKOCYTESUR SMALL* 02/03/2016 2129    Creatinine Clearance: CrCl cannot be calculated (Unknown ideal weight.).  Sepsis  Labs: @LABRCNTIP (procalcitonin:4,lacticidven:4) )No results found for this or any previous visit (from the past 240 hour(s)).   Radiological Exams on Admission: [place] Abdomen Complete  02/04/2016  CLINICAL DATA:  Acute onset of epigastric abdominal pain. Nausea, vomiting and diarrhea. Initial encounter. EXAM: ABDOMEN ULTRASOUND COMPLETE COMPARISON:  None. FINDINGS: Gallbladder: No gallstones or wall thickening visualized. No sonographic Murphy sign noted by sonographer. Common bile duct: Diameter: 0.2 cm, within normal limits in caliber. Liver: No focal lesion identified. Within normal limits in parenchymal echogenicity. IVC: No abnormality visualized. Pancreas: Visualized portion unremarkable. Spleen: Size and appearance within normal limits. Right Kidney: Length: 10.8 cm. Echogenicity within normal limits. No mass or hydronephrosis visualized. Left Kidney: Length: 9.9 cm. Echogenicity within normal limits. No mass or hydronephrosis visualized. Abdominal aorta: No aneurysm visualized. Other findings: None. IMPRESSION: Unremarkable abdominal ultrasound. Electronically Signed   By: Roanna RaiderJeffery  Chang M.D.   On: 02/04/2016 02:27      Assessment/Plan Active Problems:   Rhabdomyolysis   Leukocytosis   Abdominal pain   Nausea with vomiting  Tetrahydrocannabinol (THC) use disorder, mild, abuse    Rhabdomyolysis: likely from playing multiple focal games on day of onset of symptoms. Possible viral gastroenteritis contributing as patient gave conflicting stories of how long her abdominal discomfort have been going on prior to admission. Patient also likely beginning her menstrual cycle. Despite 3 L of normal saline patients CK climbed during her time in the ED from 1206 to 1802 to 3069. Muscle aches and pains have improved with IV hydration and rest. BUN/creatinine 10/1, UA showing 30+ protein, small leukocytes, greater than 80 ketones, and TNTC RBC (patient starting menstrual cycle).  - Med surge -  Aggressive IVF NS 150 - Tylenol, Oxy - serial BMET - Serial CK until downtrending  Abd pain/n/v: Likely multifactorial including rhabdomyolysis, menstrual cramping, possible viral gastroenteritis. Abdominal ultrasound without evidence of acute process. Patient started her menstrual cycle at time of onset of symptoms. Pregnancy test negative. WBC 12.4. Symptoms improving after IV hydration. - IVF - Zofran - CBC w diff in am  THC/ETOH use: uses 4x wkly. Denies tobacco or other drug use. Several ETOH drinks weekly - CIWA - Social work for counseling.   DVT prophylaxis: Lovenox  Code Status: full  Family Communication: none  Disposition Plan: pending improvement in symptoms. Likely 24 hr obs Consults called: none  Admission status: Med surge - obs    MERRELL, DAVID J MD [facility]  If 7PM-7AM, please contact night-coverage [email or web address] Password [email or web address]  02/04/2016, 10:28 AM

## 2016-02-04 NOTE — ED Notes (Signed)
Called main lab about CK results.

## 2016-02-04 NOTE — ED Provider Notes (Signed)
CSN: [id number]     Arrival date & time 02/03/16  2110 History   First MD Initiated Contact with Patient 02/04/16 0019     Chief Complaint  Patient presents with  . Abdominal Pain     (Consider location/radiation/quality/duration/timing/severity/associated sxs/prior Treatment) The history is provided by the patient and medical records. No language interpreter was used.     Allison Nguyen is a 25 y.o. female  with No major medical history presents to the Emergency Department complaining of gradual, intermittent, generalized abdominal pain onset 3-4 weeks ago. Associated symptoms include bloating after eating.  She reports the pain gets worse after eating and is always located in the epigastrium.  She reports feeling very full and uncomfortable.  She reports this has decreased her PO intake.  Pt reports she has been able to drink fluids.  Pt reports 1 bout of emesis today described as "bile."   She denies previous episodes of emesis.  NO hematemesis.  She reports 1 episode of loose stool today.  Denies melena or hematochezia.  Pt reports she was able to play football today x 2 games and currently feels dehydrated.  She reports some body cramps over the weekend which got worse after the football game today.  Pt denies fever, chills, headache, neck pain, chest pain, SOB, dizziness, syncope, dysuria, hematuria, cola colored urine.  She reports urinating 6-7 times today.   Pt reports she began her menses today.  Pt does reports marijuana usage approx 4x per week.    Triage note reports vaginal burning and request to be checked for STD.  When questioned about this she reports it occurred last week after use of a new body wash and resolved after several days.  She denies vaginal discharge.  She reports no current symptoms and reports she only wanted to be checked for STDs because "its a good thing to do."     History reviewed. No pertinent past medical history. History reviewed. No pertinent past surgical  history. Family History  Problem Relation Age of Onset  . Hypertension Other    Social History  Substance Use Topics  . Smoking status: Never Smoker   . Smokeless tobacco: None  . Alcohol Use: 1.8 oz/week    1 Cans of beer, 2 Shots of liquor per week     Comment: 3 times/week a mixture.   OB History    No data available     Review of Systems  Constitutional: Negative for fever, diaphoresis, appetite change, fatigue and unexpected weight change.  HENT: Negative for mouth sores.   Eyes: Negative for visual disturbance.  Respiratory: Negative for cough, chest tightness, shortness of breath and wheezing.   Cardiovascular: Negative for chest pain.  Gastrointestinal: Positive for nausea, vomiting ( x1 this afternoon), abdominal pain and diarrhea (loose stool x1 tonight). Negative for constipation.  Endocrine: Negative for polydipsia, polyphagia and polyuria.  Genitourinary: Negative for dysuria, urgency, frequency and hematuria.  Musculoskeletal: Negative for back pain and neck stiffness.  Skin: Negative for rash.  Allergic/Immunologic: Negative for immunocompromised state.  Neurological: Negative for syncope, light-headedness and headaches.  Hematological: Does not bruise/bleed easily.  Psychiatric/Behavioral: Negative for sleep disturbance. The patient is not nervous/anxious.       Allergies  Review of patient's allergies indicates no known allergies.  Home Medications   Prior to Admission medications   Medication Sig Start Date End Date Taking? Authorizing Provider  ondansetron (ZOFRAN ODT) 8 MG disintegrating tablet Take 1 tablet (8 mg total) by mouth  every 8 (eight) hours as needed for refractory nausea / vomiting. Patient not taking: Reported on 02/04/2016 11/06/14   Zadie Rhine, MD   BP 103/64 mmHg  Pulse 72  Temp(Src) 97.9 F (36.6 C) (Oral)  Resp 16  SpO2 100%  LMP 02/03/2016 Physical Exam  Constitutional: She appears well-developed and well-nourished. No  distress.  Awake, alert, nontoxic appearance  HENT:  Head: Normocephalic and atraumatic.  Mouth/Throat: Oropharynx is clear and moist. No oropharyngeal exudate.  Eyes: Conjunctivae are normal. No scleral icterus.  Neck: Normal range of motion. Neck supple.  Cardiovascular: Normal rate, regular rhythm, normal heart sounds and intact distal pulses.   Pulmonary/Chest: Effort normal and breath sounds normal. No respiratory distress. She has no wheezes.  Equal chest expansion  Abdominal: Soft. Bowel sounds are normal. She exhibits no distension and no mass. There is tenderness in the right upper quadrant. There is no rebound, no guarding and no CVA tenderness.  Mild TTP of the RUQ without guarding or rebound.  No CVA tenderness  Musculoskeletal: Normal range of motion. She exhibits no edema.  Neurological: She is alert.  Speech is clear and goal oriented Moves extremities without ataxia  Skin: Skin is warm and dry. She is not diaphoretic.  Psychiatric: She has a normal mood and affect.  Nursing note and vitals reviewed.   ED Course  Procedures (including critical care time) Labs Review Labs Reviewed  COMPREHENSIVE METABOLIC PANEL - Abnormal; Notable for the following:    Glucose, Bld 125 (*)    AST 42 (*)    All other components within normal limits  CBC - Abnormal; Notable for the following:    WBC 12.4 (*)    All other components within normal limits  URINALYSIS, ROUTINE W REFLEX MICROSCOPIC (NOT AT [facility]) - Abnormal; Notable for the following:    APPearance CLOUDY (*)    Hgb urine dipstick LARGE (*)    Ketones, ur >80 (*)    Protein, ur 30 (*)    Leukocytes, UA SMALL (*)    All other components within normal limits  URINE MICROSCOPIC-ADD ON - Abnormal; Notable for the following:    Squamous Epithelial / LPF 0-5 (*)    Bacteria, UA FEW (*)    All other components within normal limits  CK - Abnormal; Notable for the following:    Total CK 1206 (*)    All other components  within normal limits  CK - Abnormal; Notable for the following:    Total CK 1802 (*)    All other components within normal limits  LIPASE, BLOOD  I-STAT BETA HCG BLOOD, ED ([facility], [facility], [facility] ONLY)    Imaging Review US Abdomen Complete  02/04/2016  CLINICAL DATA:  Acute onset of epigastric abdominal pain. Nausea, vomiting and diarrhea. Initial encounter. EXAM: ABDOMEN ULTRASOUND COMPLETE COMPARISON:  None. FINDINGS: Gallbladder: No gallstones or wall thickening visualized. No sonographic Murphy sign noted by sonographer. Common bile duct: Diameter: 0.2 cm, within normal limits in caliber. Liver: No focal lesion identified. Within normal limits in parenchymal echogenicity. IVC: No abnormality visualized. Pancreas: Visualized portion unremarkable. Spleen: Size and appearance within normal limits. Right Kidney: Length: 10.8 cm. Echogenicity within normal limits. No mass or hydronephrosis visualized. Left Kidney: Length: 9.9 cm. Echogenicity within normal limits. No mass or hydronephrosis visualized. Abdominal aorta: No aneurysm visualized. Other findings: None. IMPRESSION: Unremarkable abdominal ultrasound. Electronically Signed   By: Roanna Raider M.D.   On: 02/04/2016 02:27   I have personally reviewed and evaluated  these images and lab results as part of my medical decision-making.    MDM   Final diagnoses:  Non-traumatic rhabdomyolysis  Epigastric abdominal pain   Tommi EmeryKirsten Kestenbaum sense of multiple complaints. She is complaining of green one month of upper epigastric abdominal pain worse after eating. One episode of vomiting tonight which she described as bilious. Abdomen is generally tender but without rebound or guarding.  Mild monocytosis noted. Ultrasound shows no evidence of cholecystitis. No gallstones noted. Patient given Protonix and feels significantly better.  Patient also complaining of muscle cramps. Urinalysis is concentrated with greater than 80 ketones. Patient played multiple football  games today. She has an elevated CK of 1206.  No evidence of acute kidney injury. No elevation in serum creatinine. Patient has been given 2 L of fluid. Repeat CK is 1802.    She is tolerating fluids by mouth without vomiting or increased abdominal pain. Patient will be started on omeprazole and given GI follow-up. Patient will also be referred to the women's clinic for STD testing.  6:54 AM Discussed with Dr. Toniann FailKakrakandy who requested that patient be given a third liter of fluid with a repeat CK before admission.  He states that if her CK decreases she'll be discharged home.  Her serum creatinine was normal on initial exam. I am however concerned about her rising CK.  At shift change care transferred [facility], PA-C.  He will give third liter and reassess CK. If CK has not decreased significantly with strongly suggested admission.  Dahlia ClientHannah Valarie Farace, PA-C 02/04/16 [phone]  Shon Batonourtney F Horton, MD 02/04/16 2308

## 2016-02-04 NOTE — ED Notes (Signed)
EDP updated on pt CK. En route

## 2016-02-04 NOTE — ED Provider Notes (Signed)
Patient signed to me by Muthersbaugh, PA-C.  Plan: Repeat CK and give more fluids.  9:45 AM CK has increased to 3k.  Will consult hospitalist.  Appreciate Dr. Merrell and hospitaliKonrad Doloresst team for observing patient overnight.  Roxy Horsemanobert Liany Mumpower, PA-C 02/04/16 0946  Layla MawKristen N Ward, DO 02/07/16 2142

## 2016-02-04 NOTE — ED Notes (Signed)
Pelvic exam DC per EDP

## 2016-02-04 NOTE — ED Notes (Signed)
EDP at bedside  

## 2016-02-05 DIAGNOSIS — D72829 Elevated white blood cell count, unspecified: Secondary | ICD-10-CM | POA: Diagnosis not present

## 2016-02-05 DIAGNOSIS — R112 Nausea with vomiting, unspecified: Secondary | ICD-10-CM | POA: Diagnosis not present

## 2016-02-05 DIAGNOSIS — R109 Unspecified abdominal pain: Secondary | ICD-10-CM | POA: Diagnosis not present

## 2016-02-05 DIAGNOSIS — T796XXA Traumatic ischemia of muscle, initial encounter: Secondary | ICD-10-CM | POA: Diagnosis not present

## 2016-02-05 LAB — BASIC METABOLIC PANEL
ANION GAP: 6 (ref 5–15)
BUN: 7 mg/dL (ref 6–20)
CHLORIDE: 111 mmol/L (ref 101–111)
CO2: 21 mmol/L — AB (ref 22–32)
Calcium: 8.2 mg/dL — ABNORMAL LOW (ref 8.9–10.3)
Creatinine, Ser: 0.65 mg/dL (ref 0.44–1.00)
GFR calc non Af Amer: >60 mL/min (ref >60)
GLUCOSE: 90 mg/dL (ref 65–99)
Potassium: 3.6 mmol/L (ref 3.5–5.1)
Sodium: 138 mmol/L (ref 135–145)

## 2016-02-05 LAB — CBC WITH DIFFERENTIAL/PLATELET
Basophils Absolute: 0 10*3/uL (ref 0.0–0.1)
Basophils Relative: 0 %
Eosinophils Absolute: 0.1 10*3/uL (ref 0.0–0.7)
Eosinophils Relative: 1 %
HEMATOCRIT: 33 % — AB (ref 36.0–46.0)
HEMOGLOBIN: 11 g/dL — AB (ref 12.0–15.0)
LYMPHS ABS: 2.6 10*3/uL (ref 0.7–4.0)
Lymphocytes Relative: 38 %
MCH: 29.6 pg (ref 26.0–34.0)
MCHC: 33.3 g/dL (ref 30.0–36.0)
MCV: 88.9 fL (ref 78.0–100.0)
MONO ABS: 0.2 10*3/uL (ref 0.1–1.0)
MONOS PCT: 3 %
NEUTROS ABS: 4.1 10*3/uL (ref 1.7–7.7)
NEUTROS PCT: 58 %
Platelets: 213 10*3/uL (ref 150–400)
RBC: 3.71 MIL/uL — ABNORMAL LOW (ref 3.87–5.11)
RDW: 12.7 % (ref 11.5–15.5)
WBC: 6.9 10*3/uL (ref 4.0–10.5)

## 2016-02-05 LAB — CK
CK TOTAL: 2357 U/L — AB (ref 38–234)
Total CK: 2082 U/L — ABNORMAL HIGH (ref 38–234)

## 2016-02-05 MED ORDER — MENTHOL 3 MG MT LOZG
1.0000 | LOZENGE | OROMUCOSAL | Status: DC | PRN
  Administered 2016-02-05: 3 mg via ORAL
  Filled 2016-02-05: qty 9

## 2016-02-05 NOTE — Progress Notes (Signed)
PROGRESS NOTE    Allison EmeryKirsten Bark  ZOX:[id number] DOB: 07/06/1991 DOA: 02/04/2016 PCP: No PCP Per Patient   Brief Narrative:  25 year old with no past medical history presented with nausea, vomiting, abdominal pain and body aches. Patient found to have acute rhabdomyolysis. Admitted and given IV fluids, continue monitor CK level.  Assessment & Plan   Acute rhabdomyolysis -Upon admission, CK levels were up to 3000, currently 2082 -Likely secondary to sports and physical activity -Continue IV fluids, supportive care -Monitor CK levels -Muscle aches have improved -Creatinine stable  Abdominal pain, nausea and vomiting -Likely multifactorial including possible viral gastroenteritis versus rhabdomyolysis -Abdominal ultrasound unremarkable -Patient also currently on her menstrual cycle -Leukocytosis has resolved -Continue supportive care  Alcohol and marijuana abuse -Continue CIWA protocol -She counseled on cessation  DVT Prophylaxis  lovenox  Code Status: Full  Family Communication: None at bedside  Disposition Plan: Admitted for observation. Discharge when CK levels have markedly improved  Consultants None  Procedures  None  Antibiotics   Anti-infectives    None      Subjective:   Allison EmeryKirsten Biddy seen and examined today.  Patient states she is feeling better, denies further muscle aches and pains. Denies any further abdominal pain, nausea or vomiting. Currently denies any shortness of breath, dizziness, palpitations headaches, diarrhea or constipation  Objective:   Filed Vitals:   02/04/16 1129 02/04/16 1445 02/04/16 2113 02/05/16 0416  BP:  117/76 136/95 128/71  Pulse:  65 71 75  Temp:  98.2 F (36.8 C) 98.3 F (36.8 C) 97.3 F (36.3 C)  TempSrc:  Oral Oral Oral  Resp:  18 16 16   Height: 5\' 6"  (1.676 m)     Weight: 61.236 kg (135 lb)     SpO2:  100% 100% 100%    Intake/Output Summary (Last 24 hours) at 02/05/16 1354 Last data filed at 02/05/16 0545  Gross per 24 hour  Intake 2962.5 ml  Output      0 ml  Net 2962.5 ml   Filed Weights   02/04/16 1129  Weight: 61.236 kg (135 lb)    Exam  General: Well developed, well nourished, NAD, appears stated age  HEENT: NCAT,  mucous membranes moist.   Cardiovascular: S1 S2 auscultated, no murmurs, RRR  Respiratory: Clear to auscultation bilaterally   Abdomen: Soft, nontender, nondistended, + bowel sounds  Extremities: warm dry without cyanosis clubbing or edema  Neuro: AAOx3, nonfocal  Psych: Normal affect and demeanor    Data Reviewed: I have personally reviewed following labs and imaging studies  CBC:  Recent Labs Lab 02/03/16 2130 02/05/16 0200  WBC 12.4* 6.9  NEUTROABS  --  4.1  HGB 12.9 11.0*  HCT 38.8 33.0*  MCV 89.0 88.9  PLT 255 213   Basic Metabolic Panel:  Recent Labs Lab 02/03/16 2130 02/05/16 0200  NA 137 138  K 3.6 3.6  CL 103 111  CO2 22 21*  GLUCOSE 125* 90  BUN 10 7  CREATININE 0.99 0.65  CALCIUM 9.4 8.2*   GFR: Estimated Creatinine Clearance: 102.4 mL/min (by C-G formula based on Cr of 0.65). Liver Function Tests:  Recent Labs Lab 02/03/16 2130  AST 42*  ALT 27  ALKPHOS 54  BILITOT 0.6  PROT 7.4  ALBUMIN 4.4    Recent Labs Lab 02/03/16 2130  LIPASE 23   No results for input(s): AMMONIA in the last 168 hours. Coagulation Profile: No results for input(s): INR, PROTIME in the last 168 hours. Cardiac Enzymes:  Recent Labs  Lab 02/04/16 0822 02/04/16 1419 02/04/16 1940 02/05/16 0200 02/05/16 1610  CKTOTAL 3069* 2888* 2978* 2357* 2082*   BNP (last 3 results) No results for input(s): PROBNP in the last 8760 hours. HbA1C: No results for input(s): HGBA1C in the last 72 hours. CBG: No results for input(s): GLUCAP in the last 168 hours. Lipid Profile: No results for input(s): CHOL, HDL, LDLCALC, TRIG, CHOLHDL, LDLDIRECT in the last 72 hours. Thyroid Function Tests: No results for input(s): TSH, T4TOTAL, FREET4, T3FREE,  THYROIDAB in the last 72 hours. Anemia Panel: No results for input(s): VITAMINB12, FOLATE, FERRITIN, TIBC, IRON, RETICCTPCT in the last 72 hours. Urine analysis:    Component Value Date/Time   COLORURINE YELLOW 02/03/2016 2129   APPEARANCEUR CLOUDY* 02/03/2016 2129   LABSPEC 1.023 02/03/2016 2129   PHURINE 5.5 02/03/2016 2129   GLUCOSEU NEGATIVE 02/03/2016 2129   HGBUR LARGE* 02/03/2016 2129   BILIRUBINUR NEGATIVE 02/03/2016 2129   KETONESUR >80* 02/03/2016 2129   PROTEINUR 30* 02/03/2016 2129   UROBILINOGEN 1.0 11/06/2014 0058   NITRITE NEGATIVE 02/03/2016 2129   LEUKOCYTESUR SMALL* 02/03/2016 2129   Sepsis Labs: (procalcitonin:4,lacticidven:4)  )No results found for this or any previous visit (from the past 240 hour(s)).    Radiology Studies: US Abdomen Complete  02/04/2016  CLINICAL DATA:  Acute onset of epigastric abdominal pain. Nausea, vomiting and diarrhea. Initial encounter. EXAM: ABDOMEN ULTRASOUND COMPLETE COMPARISON:  None. FINDINGS: Gallbladder: No gallstones or wall thickening visualized. No sonographic Murphy sign noted by sonographer. Common bile duct: Diameter: 0.2 cm, within normal limits in caliber. Liver: No focal lesion identified. Within normal limits in parenchymal echogenicity. IVC: No abnormality visualized. Pancreas: Visualized portion unremarkable. Spleen: Size and appearance within normal limits. Right Kidney: Length: 10.8 cm. Echogenicity within normal limits. No mass or hydronephrosis visualized. Left Kidney: Length: 9.9 cm. Echogenicity within normal limits. No mass or hydronephrosis visualized. Abdominal aorta: No aneurysm visualized. Other findings: None. IMPRESSION: Unremarkable abdominal ultrasound. Electronically Signed   By: Roanna Raider M.D.   On: 02/04/2016 02:27     Scheduled Meds: . enoxaparin (LOVENOX) injection  40 mg Subcutaneous Q24H  . folic acid  1 mg Oral Daily  . multivitamin with minerals  1 tablet Oral Daily  . thiamine   100 mg Oral Daily   Continuous Infusions: . sodium chloride 150 mL/hr at 02/05/16 1101       Time Spent in minutes   30 minutes  Tramain Gershman D.O. on 02/05/2016 at 1:54 PM  Between 7am to 7pm - Pager - [phone]  After 7pm go to [email or web address] - password [email or web address]  And look for the night coverage person covering for me after hours  [facility] Office  [phone]

## 2016-02-06 ENCOUNTER — Inpatient Hospital Stay: Admitting: Internal Medicine

## 2016-02-06 DIAGNOSIS — T796XXA Traumatic ischemia of muscle, initial encounter: Secondary | ICD-10-CM | POA: Diagnosis not present

## 2016-02-06 DIAGNOSIS — R109 Unspecified abdominal pain: Secondary | ICD-10-CM | POA: Diagnosis not present

## 2016-02-06 DIAGNOSIS — R112 Nausea with vomiting, unspecified: Secondary | ICD-10-CM | POA: Diagnosis not present

## 2016-02-06 DIAGNOSIS — D72829 Elevated white blood cell count, unspecified: Secondary | ICD-10-CM | POA: Diagnosis not present

## 2016-02-06 LAB — BASIC METABOLIC PANEL
ANION GAP: 6 (ref 5–15)
CO2: 24 mmol/L (ref 22–32)
Calcium: 8.5 mg/dL — ABNORMAL LOW (ref 8.9–10.3)
Chloride: 110 mmol/L (ref 101–111)
Creatinine, Ser: 0.68 mg/dL (ref 0.44–1.00)
Glucose, Bld: 86 mg/dL (ref 65–99)
POTASSIUM: 3.8 mmol/L (ref 3.5–5.1)
SODIUM: 140 mmol/L (ref 135–145)

## 2016-02-06 LAB — CK: CK TOTAL: 1027 U/L — AB (ref 38–234)

## 2016-02-06 MED ORDER — PROMETHAZINE HCL 25 MG PO TABS: 25.0000 mg | ORAL_TABLET | Freq: Four times a day (QID) | ORAL | Status: AC | PRN

## 2016-02-06 MED ORDER — TRAMADOL HCL 50 MG PO TABS: 50.0000 mg | ORAL_TABLET | Freq: Three times a day (TID) | ORAL | Status: AC | PRN

## 2016-02-06 NOTE — Care Management Note (Addendum)
Case Management Note  Patient Details  Name: Allison Nguyen MRN: [id number] Date of Birth: 01/06/1991  Subjective/Objective:                    Action/Plan: Plan is to d/c to home today. No further needs identified per CM.  Expected Discharge Date:    02/06/2016            Expected Discharge Plan:  Home/Self Care  In-House Referral:     Discharge planning Services  CM Consult  Status of Service:  Completed, signed off    Epifanio LeschesCole, Ranen Doolin Hudson, [place]RN 02/06/2016, 11:20 AM

## 2016-02-06 NOTE — Progress Notes (Signed)
Discharge instructions reviewed with the patient and family to include activity, Medications and follow up appointments.  All voice understanding to teaching.  Patient Ambulatory to the door.  Home via POV with her uncle driving.

## 2016-02-06 NOTE — Discharge Summary (Signed)
Physician Discharge Summary   Patient ID: Allison Nguyen MRN: [id number] DOB/AGE: [birth year] 24 y.o.  Admit date: 02/04/2016 Discharge date: 02/06/2016  Primary Care Physician:  No PCP Per Patient  Discharge Diagnoses:    . Acute Rhabdomyolysis secondary to strenuous exercise  Alcohol use  Marijuana use  Consults: None  Recommendations for Outpatient Follow-up:  1. Patient was recommended no exercise or high protein shakes for one week.   DIET: Regular diet    Allergies:  No Known Allergies   DISCHARGE MEDICATIONS: Current Discharge Medication List    START taking these medications   Details  promethazine (PHENERGAN) 25 MG tablet Take 1 tablet (25 mg total) by mouth every 6 (six) hours as needed for nausea or vomiting. Qty: 30 tablet, Refills: 0    traMADol (ULTRAM) 50 MG tablet Take 1 tablet (50 mg total) by mouth every 8 (eight) hours as needed for moderate pain or severe pain. Qty: 30 tablet, Refills: 0      STOP taking these medications     ondansetron (ZOFRAN ODT) 8 MG disintegrating tablet          Brief H and P: For complete details please refer to admission H and P, but in brief Patient is a 25 year old female with no past medical history presented with nausea, vomiting, abdominal pain and body aches. She was found to have acute rhabdomyolysis. Patient was admitted for further workup.  Hospital Course:  Acute rhabdomyolysis: Due to strenuous exercise and multiple sports activities -Upon admission, CK levels were up to 3000 -Patient was placed on aggressive IV fluid hydration, creatinine function is normalized, 0.68. CK 1027 at the time of discharge.  Abdominal pain, nausea and vomiting -Likely multifactorial including possible viral gastroenteritis versus rhabdomyolysis -Abdominal ultrasound unremarkable -Patient also currently on her menstrual cycle -Leukocytosis has resolved  Alcohol and marijuana abuse -Continue CIWA protocol, counseled on  cessation  Day of Discharge BP 120/70 mmHg  Pulse 66  Temp(Src) 98 F (36.7 C) (Oral)  Resp 17  Ht  (1.676 m)  Wt 61.236 kg (135 lb)  BMI 21.80 kg/m2  SpO2 100%  LMP 02/03/2016  Physical Exam: General: Alert and awake oriented x3 not in any acute distress. HEENT: anicteric sclera, pupils reactive to light and accommodation CVS: S1-S2 clear no murmur rubs or gallops Chest: clear to auscultation bilaterally, no wheezing rales or rhonchi Abdomen: soft nontender, nondistended, normal bowel sounds Extremities: no cyanosis, clubbing or edema noted bilaterally Neuro: Cranial nerves II-XII intact, no focal neurological deficits   The results of significant diagnostics from this hospitalization (including imaging, microbiology, ancillary and laboratory) are listed below for reference.    LAB RESULTS: Basic Metabolic Panel:  Recent Labs Lab 02/05/16 0200 02/06/16 0453  NA 138 140  K 3.6 3.8  CL 111 110  CO2 21* 24  GLUCOSE 90 86  BUN 7 <5*  CREATININE 0.65 0.68  CALCIUM 8.2* 8.5*   Liver Function Tests:  Recent Labs Lab 02/03/16 2130  AST 42*  ALT 27  ALKPHOS 54  BILITOT 0.6  PROT 7.4  ALBUMIN 4.4    Recent Labs Lab 02/03/16 2130  LIPASE 23   No results for input(s): AMMONIA in the last 168 hours. CBC:  Recent Labs Lab 02/03/16 2130 02/05/16 0200  WBC 12.4* 6.9  NEUTROABS  --  4.1  HGB 12.9 11.0*  HCT 38.8 33.0*  MCV 89.0 88.9  PLT 255 213   Cardiac Enzymes:  Recent Labs Lab 02/05/16 0918 02/06/16 0453  CKTOTAL 2082* 1027*   BNP: Invalid input(s): POCBNP CBG: No results for input(s): GLUCAP in the last 168 hours.  Significant Diagnostic Studies:  [place] Abdomen Complete  02/04/2016  CLINICAL DATA:  Acute onset of epigastric abdominal pain. Nausea, vomiting and diarrhea. Initial encounter. EXAM: ABDOMEN ULTRASOUND COMPLETE COMPARISON:  None. FINDINGS: Gallbladder: No gallstones or wall thickening visualized. No sonographic Murphy sign  noted by sonographer. Common bile duct: Diameter: 0.2 cm, within normal limits in caliber. Liver: No focal lesion identified. Within normal limits in parenchymal echogenicity. IVC: No abnormality visualized. Pancreas: Visualized portion unremarkable. Spleen: Size and appearance within normal limits. Right Kidney: Length: 10.8 cm. Echogenicity within normal limits. No mass or hydronephrosis visualized. Left Kidney: Length: 9.9 cm. Echogenicity within normal limits. No mass or hydronephrosis visualized. Abdominal aorta: No aneurysm visualized. Other findings: None. IMPRESSION: Unremarkable abdominal ultrasound. Electronically Signed   By: Roanna RaiderJeffery  Chang M.D.   On: 02/04/2016 02:27    2D ECHO:   Disposition and Follow-up: Discharge Instructions    Diet general    Complete by:  As directed      Discharge instructions    Complete by:  As directed   No strenuous exercise, running or sports for 1 week  Stay hydrated, Gatorade. Hold off on protein shakes this week.     Increase activity slowly    Complete by:  As directed             DISPOSITION: Home   DISCHARGE FOLLOW-UP Follow-up Information    Follow up with [facility]. Call in 1 week.   Why:  Post hospital follow appointment    Contact information:   [address] [place] [id number] [phone]       Time spent on Discharge: 25 minutes  Signed:   Rolin Schult M.D. [facility] 02/06/2016, 1:32 PM Pager: [phone]

## 2016-05-23 IMAGING — US US ABDOMEN COMPLETE
1 series · 14 of 25 positions shown · non-contrast
Comparison: None.

CLINICAL DATA: Acute onset of epigastric abdominal pain. Nausea,
vomiting and diarrhea. Initial encounter.

EXAM:
ABDOMEN ULTRASOUND COMPLETE

[Series 1: us abdomen complete · 0.19mm/px · 14 of 97 slices shown]
[im 1/97]
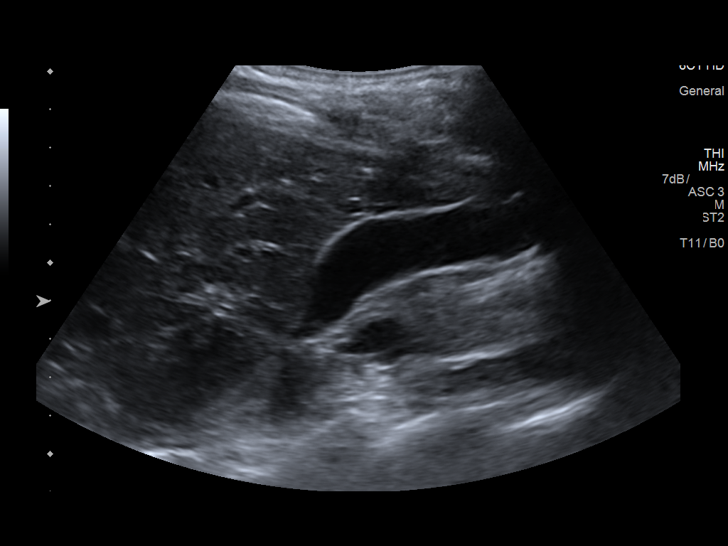
[im 9/97]
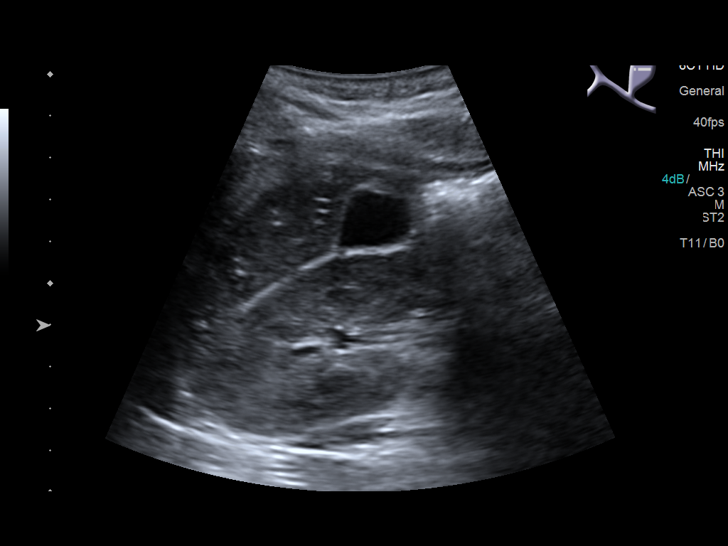
[im 17/97]
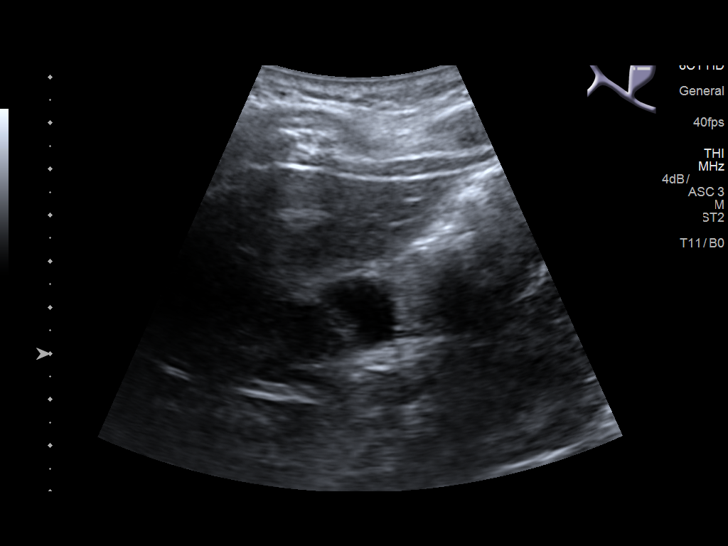
[im 25/97]
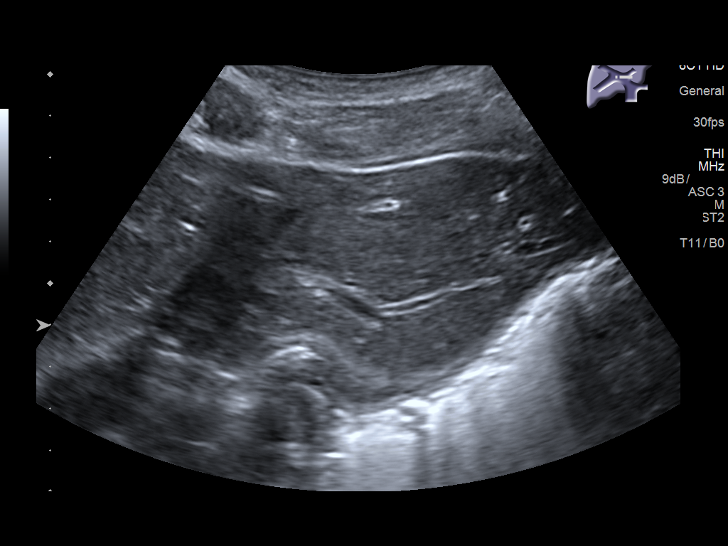
[im 33/97]
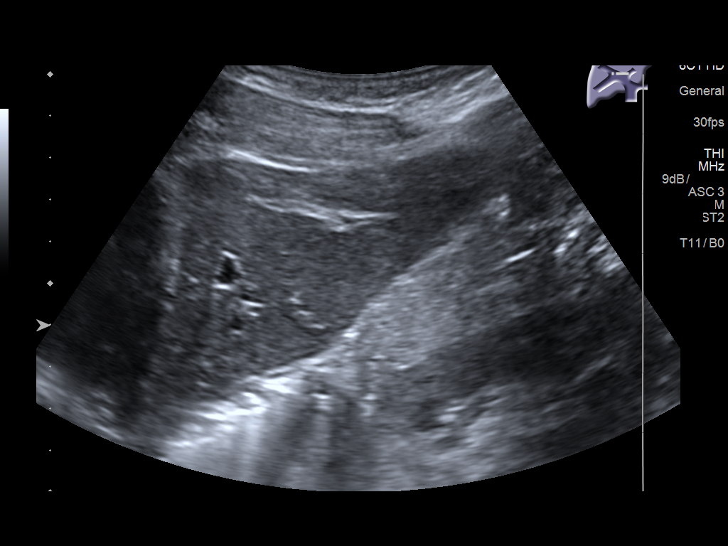
[im 37/97]
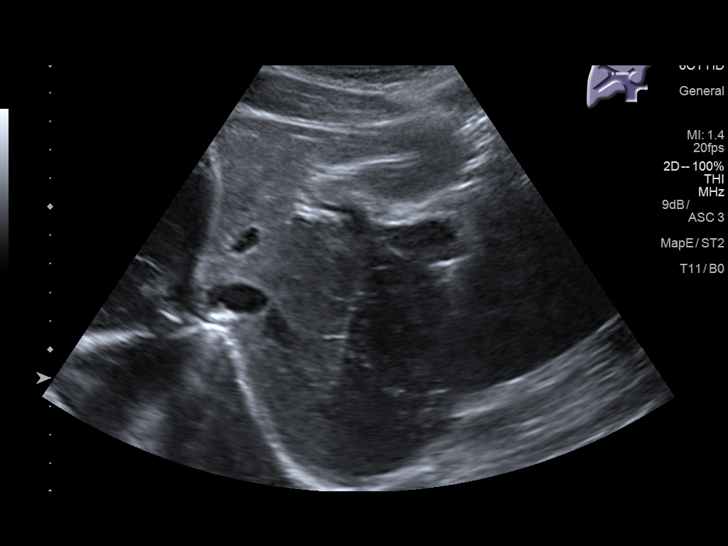
[im 45/97]
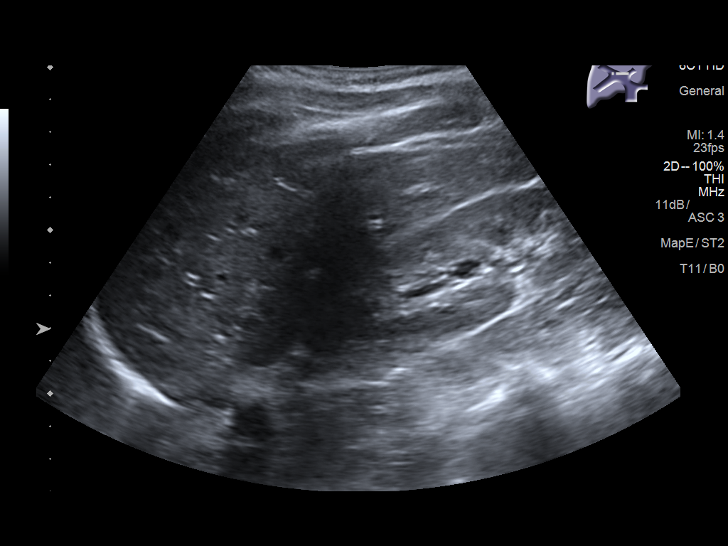
[im 53/97]
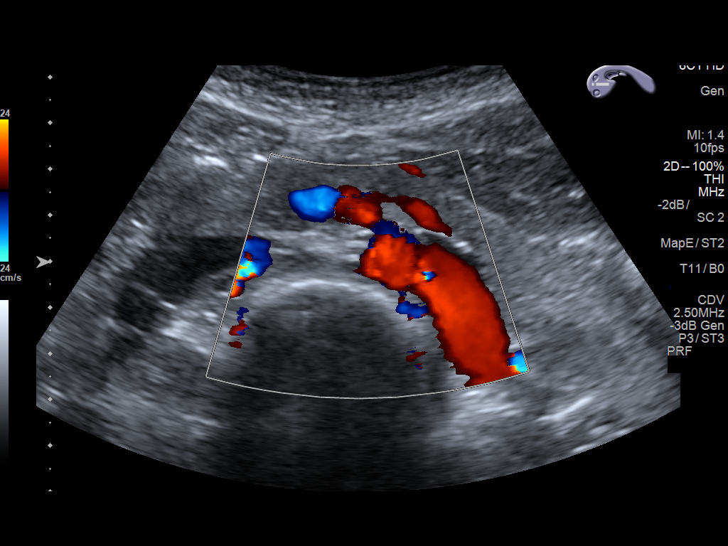
[im 61/97]
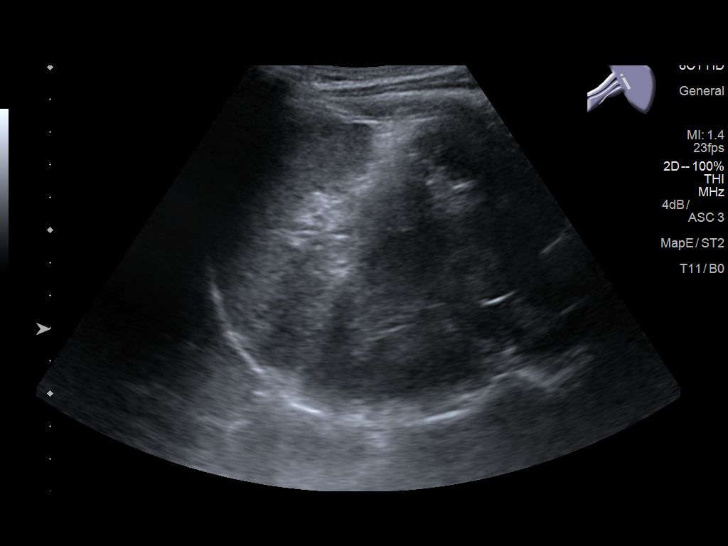
[im 65/97]
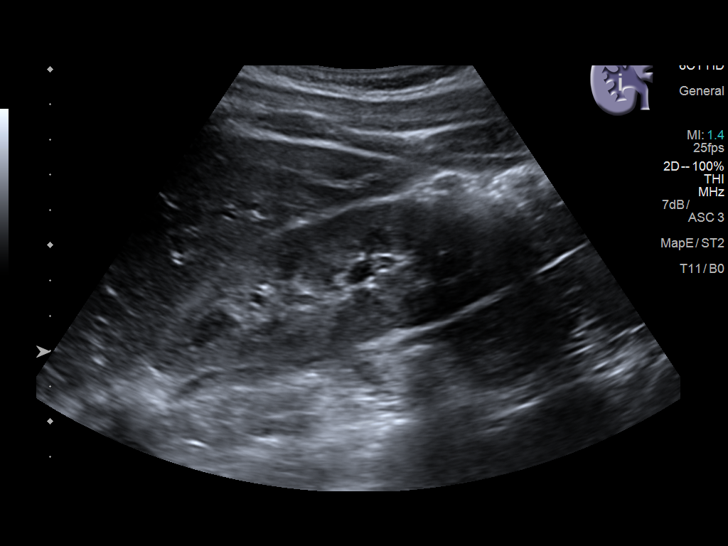
[im 73/97]
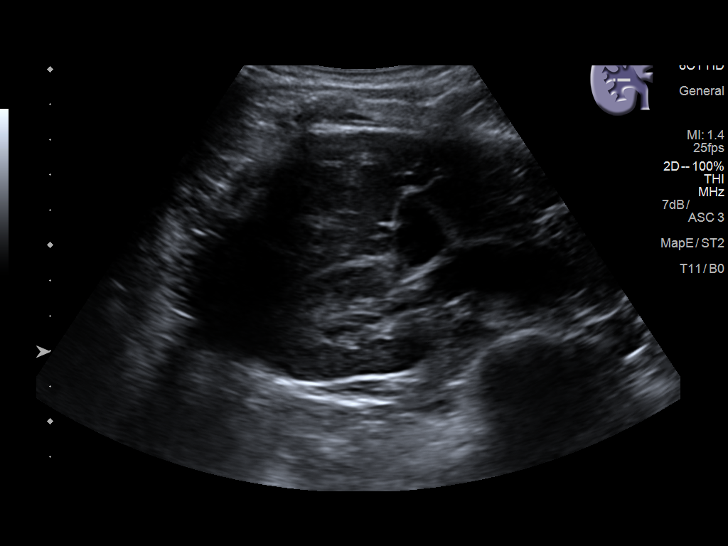
[im 81/97]
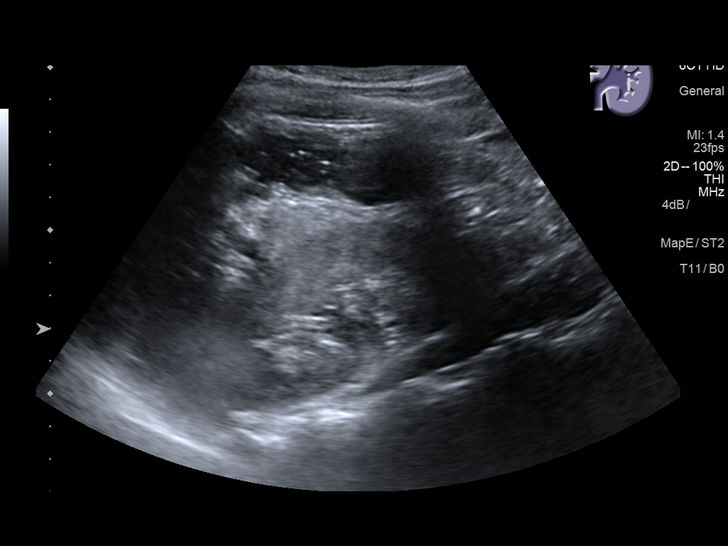
[im 89/97]
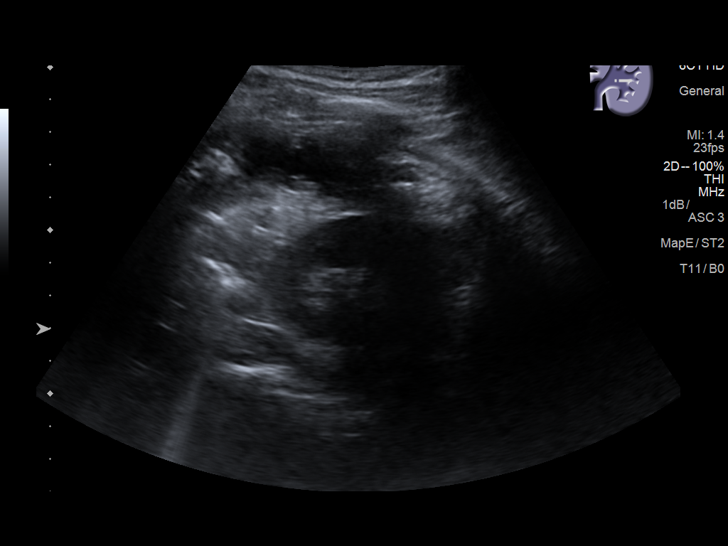
[im 97/97]
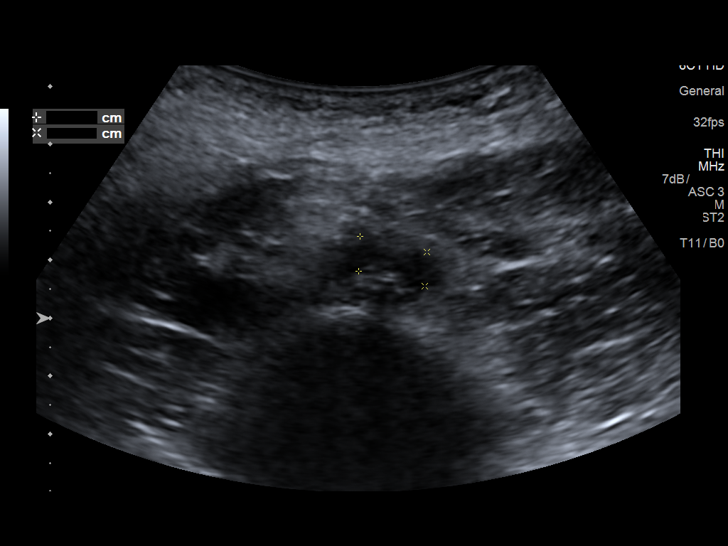

[14 of 25 positions shown; findings below may reference images not displayed]

FINDINGS: Gallbladder: No gallstones or wall thickening visualized. No
sonographic Murphy sign noted by sonographer.

Common bile duct: Diameter: 0.2 cm, within normal limits in caliber.

Liver: No focal lesion identified. Within normal limits in
parenchymal echogenicity.

IVC: No abnormality visualized.

Pancreas: Visualized portion unremarkable.

Spleen: Size and appearance within normal limits.

Right Kidney: Length: 10.8 cm. Echogenicity within normal limits. No
mass or hydronephrosis visualized.

Left Kidney: Length: 9.9 cm. Echogenicity within normal limits. No
mass or hydronephrosis visualized.

Abdominal aorta: No aneurysm visualized.

Other findings: None.
IMPRESSION: Unremarkable abdominal ultrasound.
